# Patient Record
Sex: Female | Born: 1945 | Race: White | Hispanic: No | Marital: Married | Smoking: Former smoker
Health system: Southern US, Community
[De-identification: ages and names within clinical notes are randomized; demographics above are authoritative.]

## PROBLEM LIST (undated history)

## (undated) DIAGNOSIS — T8859XA Other complications of anesthesia, initial encounter: Secondary | ICD-10-CM

## (undated) DIAGNOSIS — K219 Gastro-esophageal reflux disease without esophagitis: Secondary | ICD-10-CM

## (undated) DIAGNOSIS — J45909 Unspecified asthma, uncomplicated: Secondary | ICD-10-CM

## (undated) DIAGNOSIS — F4310 Post-traumatic stress disorder, unspecified: Secondary | ICD-10-CM

## (undated) DIAGNOSIS — R112 Nausea with vomiting, unspecified: Secondary | ICD-10-CM

## (undated) DIAGNOSIS — T4145XA Adverse effect of unspecified anesthetic, initial encounter: Secondary | ICD-10-CM

## (undated) DIAGNOSIS — R0600 Dyspnea, unspecified: Secondary | ICD-10-CM

## (undated) DIAGNOSIS — I10 Essential (primary) hypertension: Secondary | ICD-10-CM

## (undated) DIAGNOSIS — R519 Headache, unspecified: Secondary | ICD-10-CM

## (undated) DIAGNOSIS — R51 Headache: Secondary | ICD-10-CM

## (undated) DIAGNOSIS — D649 Anemia, unspecified: Secondary | ICD-10-CM

## (undated) DIAGNOSIS — F419 Anxiety disorder, unspecified: Secondary | ICD-10-CM

## (undated) DIAGNOSIS — E119 Type 2 diabetes mellitus without complications: Secondary | ICD-10-CM

## (undated) DIAGNOSIS — G4730 Sleep apnea, unspecified: Secondary | ICD-10-CM

## (undated) DIAGNOSIS — M1990 Unspecified osteoarthritis, unspecified site: Secondary | ICD-10-CM

## (undated) DIAGNOSIS — L409 Psoriasis, unspecified: Secondary | ICD-10-CM

## (undated) DIAGNOSIS — Z9889 Other specified postprocedural states: Secondary | ICD-10-CM

## (undated) DIAGNOSIS — F32A Depression, unspecified: Secondary | ICD-10-CM

## (undated) DIAGNOSIS — G629 Polyneuropathy, unspecified: Secondary | ICD-10-CM

## (undated) DIAGNOSIS — N952 Postmenopausal atrophic vaginitis: Secondary | ICD-10-CM

## (undated) HISTORY — PX: JOINT REPLACEMENT: SHX530

## (undated) HISTORY — PX: EYE SURGERY: SHX253

## (undated) HISTORY — PX: DILATION AND CURETTAGE, DIAGNOSTIC / THERAPEUTIC: SUR384

## (undated) HISTORY — PX: DIAGNOSTIC LAPAROSCOPY: SUR761

## (undated) HISTORY — PX: TUBAL LIGATION: SHX77

---

## 1996-05-24 HISTORY — PX: BREAST BIOPSY: SHX20

## 2007-02-02 ENCOUNTER — Ambulatory Visit: Payer: Self-pay | Admitting: Family Medicine

## 2007-03-30 ENCOUNTER — Ambulatory Visit: Payer: Self-pay | Admitting: Gastroenterology

## 2008-06-04 ENCOUNTER — Ambulatory Visit: Payer: Self-pay | Admitting: Family Medicine

## 2009-12-22 ENCOUNTER — Ambulatory Visit: Payer: Self-pay | Admitting: Family Medicine

## 2009-12-24 ENCOUNTER — Ambulatory Visit: Payer: Self-pay | Admitting: Family Medicine

## 2010-08-12 ENCOUNTER — Ambulatory Visit: Payer: Self-pay | Admitting: Family Medicine

## 2011-09-07 ENCOUNTER — Ambulatory Visit: Payer: Self-pay | Admitting: Family Medicine

## 2012-05-10 ENCOUNTER — Encounter: Payer: Self-pay | Admitting: Family Medicine

## 2012-05-24 ENCOUNTER — Encounter: Payer: Self-pay | Admitting: Family Medicine

## 2012-06-24 ENCOUNTER — Encounter: Payer: Self-pay | Admitting: Family Medicine

## 2013-07-18 DIAGNOSIS — F4310 Post-traumatic stress disorder, unspecified: Secondary | ICD-10-CM | POA: Insufficient documentation

## 2013-08-27 ENCOUNTER — Ambulatory Visit: Payer: Self-pay | Admitting: Family Medicine

## 2013-09-04 ENCOUNTER — Ambulatory Visit: Payer: Self-pay | Admitting: Family Medicine

## 2013-09-10 DIAGNOSIS — J45909 Unspecified asthma, uncomplicated: Secondary | ICD-10-CM | POA: Insufficient documentation

## 2013-09-10 DIAGNOSIS — E559 Vitamin D deficiency, unspecified: Secondary | ICD-10-CM | POA: Insufficient documentation

## 2013-09-10 DIAGNOSIS — N952 Postmenopausal atrophic vaginitis: Secondary | ICD-10-CM | POA: Insufficient documentation

## 2014-03-06 ENCOUNTER — Ambulatory Visit: Payer: Self-pay | Admitting: Family Medicine

## 2014-11-22 ENCOUNTER — Other Ambulatory Visit: Payer: Self-pay | Admitting: Family Medicine

## 2014-11-22 DIAGNOSIS — N6310 Unspecified lump in the right breast, unspecified quadrant: Secondary | ICD-10-CM

## 2014-11-22 DIAGNOSIS — Z780 Asymptomatic menopausal state: Secondary | ICD-10-CM

## 2015-03-04 ENCOUNTER — Other Ambulatory Visit: Payer: Self-pay | Admitting: Family Medicine

## 2015-03-04 DIAGNOSIS — N6310 Unspecified lump in the right breast, unspecified quadrant: Secondary | ICD-10-CM

## 2015-03-24 ENCOUNTER — Ambulatory Visit
Admission: RE | Admit: 2015-03-24 | Discharge: 2015-03-24 | Disposition: A | Discharge status: Home / Self Care | Payer: Medicare Other | Source: Ambulatory Visit | Attending: Family Medicine | Admitting: Family Medicine

## 2015-03-24 ENCOUNTER — Other Ambulatory Visit: Payer: Self-pay | Admitting: Family Medicine

## 2015-03-24 DIAGNOSIS — Z780 Asymptomatic menopausal state: Secondary | ICD-10-CM | POA: Diagnosis present

## 2015-03-24 DIAGNOSIS — N6310 Unspecified lump in the right breast, unspecified quadrant: Secondary | ICD-10-CM

## 2015-03-24 DIAGNOSIS — N63 Unspecified lump in breast: Secondary | ICD-10-CM | POA: Diagnosis present

## 2015-03-24 DIAGNOSIS — Z13820 Encounter for screening for osteoporosis: Secondary | ICD-10-CM | POA: Insufficient documentation

## 2015-04-09 IMAGING — US US BREAST*R* LIMITED INC AXILLA
1 series · 4 of 4 positions shown · non-contrast
Comparison: 09/04/2013, 08/27/2013, 09/07/2011, additional priors
dating back to 02/02/2007

CLINICAL DATA: 67-year-old female, follow-up of probably benign
right breast findings

EXAM:
DIGITAL DIAGNOSTIC  RIGHT MAMMOGRAM WITH CAD
ULTRASOUND RIGHT BREAST

[Series 1: us breast*right* limited inc axilla · 0.08mm/px · 4 of 4 slices shown]
[im 1/4]
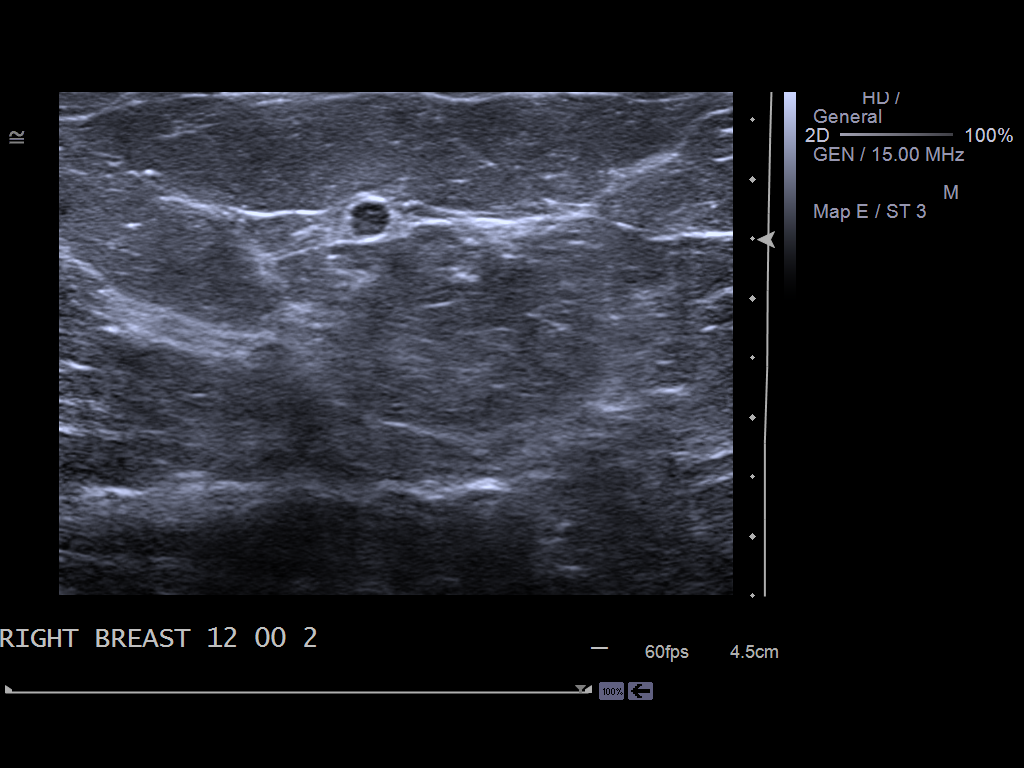
[im 2/4]
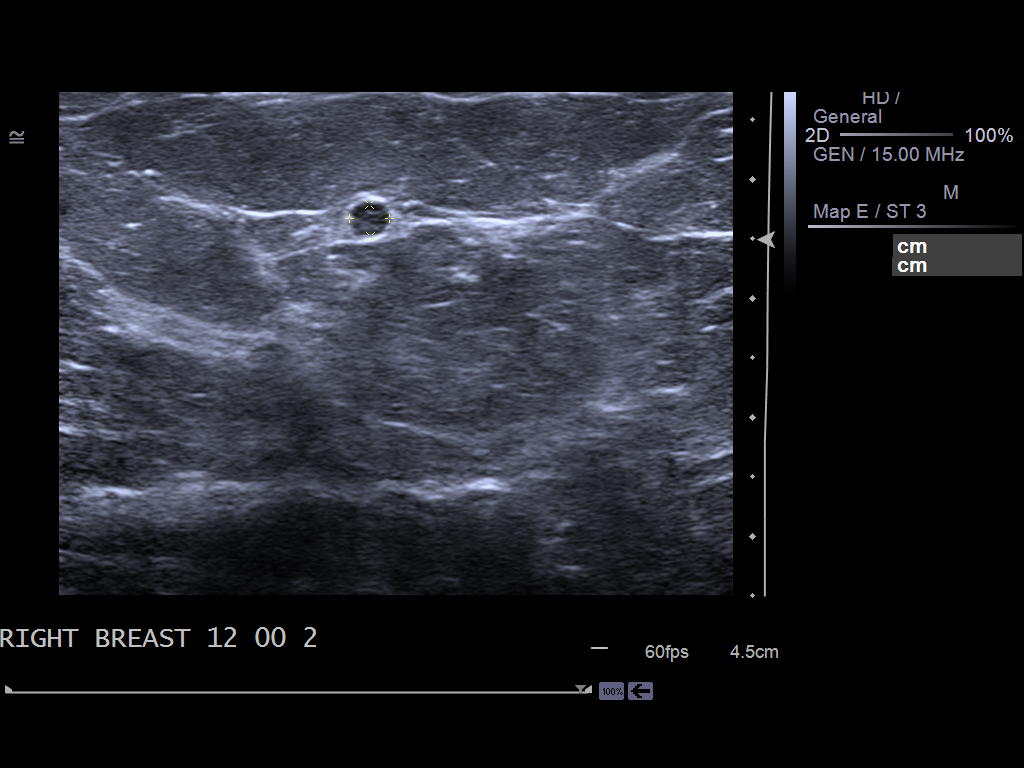
[im 3/4]
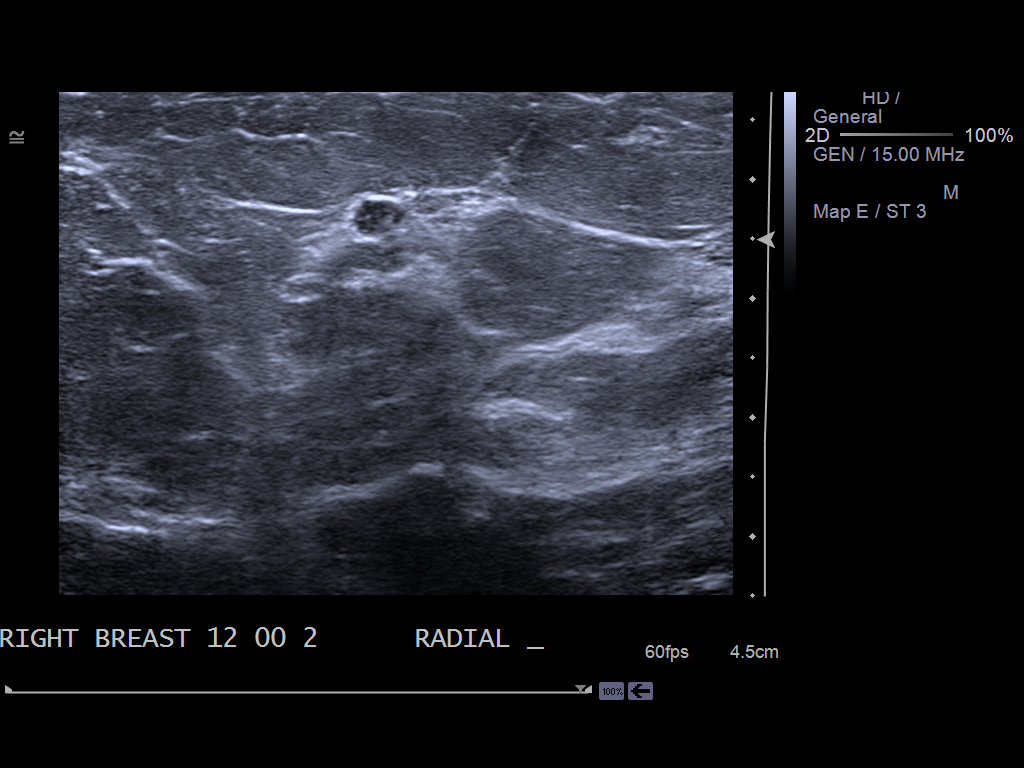
[im 4/4]
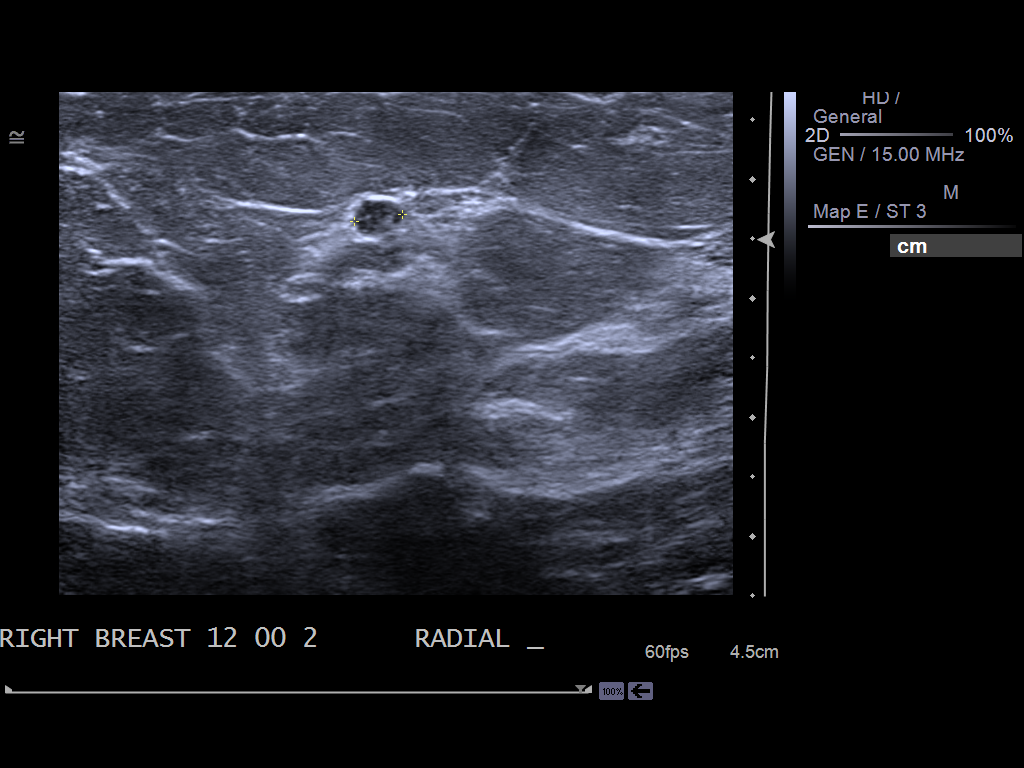

[4 of 4 positions shown; findings below may reference images not displayed]

ACR Breast Density Category b: There are scattered areas of
fibroglandular density.
FINDINGS: The previously seen mass within the subareolar right breast,
posterior depth does not appear significantly changed compared to
09/04/2013. No suspicious microcalcifications are identified.

Mammographic images were processed with CAD.

Targeted ultrasound of the right breast demonstrates no significant
interval change in appearance of an oval, hypoechoic circumscribed
mass at 12 o'clock, 2 cm from the nipple measuring 4 x 4 x 3 mm. As
noted previously, this finding is not thought to correspond to the
mammographic finding.
IMPRESSION: Probably benign right breast findings.

RECOMMENDATION:
Bilateral diagnostic mammogram and right breast ultrasound in 6
months.

I have discussed the findings and recommendations with the patient.
Results were also provided in writing at the conclusion of the
visit. If applicable, a reminder letter will be sent to the patient
regarding the next appointment.

BI-RADS CATEGORY  3: Probably benign.

## 2015-07-14 DIAGNOSIS — R0602 Shortness of breath: Secondary | ICD-10-CM | POA: Insufficient documentation

## 2015-12-16 ENCOUNTER — Other Ambulatory Visit: Payer: Self-pay | Admitting: Family Medicine

## 2015-12-16 DIAGNOSIS — N63 Unspecified lump in unspecified breast: Secondary | ICD-10-CM

## 2016-03-24 ENCOUNTER — Ambulatory Visit
Admission: RE | Admit: 2016-03-24 | Discharge: 2016-03-24 | Disposition: A | Discharge status: Home / Self Care | Payer: Medicare Other | Source: Ambulatory Visit | Attending: Family Medicine | Admitting: Family Medicine

## 2016-03-24 DIAGNOSIS — N63 Unspecified lump in unspecified breast: Secondary | ICD-10-CM

## 2016-03-24 DIAGNOSIS — N6310 Unspecified lump in the right breast, unspecified quadrant: Secondary | ICD-10-CM | POA: Diagnosis not present

## 2016-03-24 DIAGNOSIS — N6311 Unspecified lump in the right breast, upper outer quadrant: Secondary | ICD-10-CM | POA: Diagnosis not present

## 2016-04-05 ENCOUNTER — Encounter
Admission: RE | Admit: 2016-04-05 | Discharge: 2016-04-05 | Disposition: A | Discharge status: Home / Self Care | Payer: Medicare Other | Source: Ambulatory Visit | Attending: Surgery | Admitting: Surgery

## 2016-04-05 HISTORY — DX: Unspecified osteoarthritis, unspecified site: M19.90

## 2016-04-05 HISTORY — DX: Anemia, unspecified: D64.9

## 2016-04-05 HISTORY — DX: Adverse effect of unspecified anesthetic, initial encounter: T41.45XA

## 2016-04-05 HISTORY — DX: Polyneuropathy, unspecified: G62.9

## 2016-04-05 HISTORY — DX: Headache: R51

## 2016-04-05 HISTORY — DX: Post-traumatic stress disorder, unspecified: F43.10

## 2016-04-05 HISTORY — DX: Gastro-esophageal reflux disease without esophagitis: K21.9

## 2016-04-05 HISTORY — DX: Other specified postprocedural states: Z98.890

## 2016-04-05 HISTORY — DX: Sleep apnea, unspecified: G47.30

## 2016-04-05 HISTORY — DX: Other complications of anesthesia, initial encounter: T88.59XA

## 2016-04-05 HISTORY — DX: Unspecified asthma, uncomplicated: J45.909

## 2016-04-05 HISTORY — DX: Nausea with vomiting, unspecified: R11.2

## 2016-04-05 HISTORY — DX: Headache, unspecified: R51.9

## 2016-04-05 HISTORY — DX: Dyspnea, unspecified: R06.00

## 2016-04-05 HISTORY — DX: Anxiety disorder, unspecified: F41.9

## 2016-04-05 NOTE — Pre-Procedure Instructions (Signed)
Jacqueline HeckleKowalski, Bruce Jay, MD - 08/11/2015 3:30 PM EDT Formatting of this note may be different from the original. Established Patient Visit   Chief Complaint: Chief Complaint  Patient presents with  . Follow-up  echo and ett  . Shortness of Breath  Date of Service: 08/11/2015 Date of Birth: [birth year] PCP: Larene BeachVICKIE ANN Ether GriffinsFOWLER, MD  History of Present Illness: Jacqueline Miller is a 70 y.o.female patient  Patient returns today with continued shortness of breath with physical activity relieved by rest. This is waxing and waning over several month. The patient has had recent stress test showing a normal myocardial perfusion without evidence of myocardial ischemia and an echocardiogram showing normal LV systolic function with mild valvular heart disease and no evidence of pulmonary hypertension. The patient has had some improvements of shortness of breath and it appears that he does not need require other medication management or further intervention. The patient has on the CPAP machine and sleep apnea appropriately treated possibly consistent with current symptoms. Mixed hyperlipidemia has been watch very closely medication management and advice although currently will continue diet and exercise Results for orders placed or performed in visit on 08/05/15  Echocardiogram 2D complete  Result Value Ref Range  LV Ejection Fraction (%) 55  Aortic Valve Stenosis Grade none  Aortic Valve Regurgitation Grade trivial  Aortic Valve Max Velocity (m/s) 1.4 m/sec  Mitral Valve Stenosis Grade none  Mitral Valve Regurgitation Grade mild  Tricuspid Valve Regurgitation Grade mild  Tricuspid Valve Regurgitation Max Velocity (m/s) 2.3 m/sec  Right Ventricle Systolic Pressure (mmHg) 31.6 mmHg  LV End Diastolic Diameter (cm) 5.0 cm  LV End Systolic Diameter (cm) 3.5 cm  LV Septum Wall Thickness (cm) 1.3 cm  LV Posterior Wall Thickness (cm) 1.0 cm  Left Atrium Diameter (cm) 4.0 cm  Narrative  [facility] Hulan FrayMEYERS,  Rosey A [facility] [phone] [address], [place], [place] [id number] Acct #: [id number] Date: 08/05/2015 08:12 AM ECHOCARDIOGRAM REPORT Adult Female Age: [age] yrs Outpatient STUDY:CHEST WALL TAPE: KC::KCMC ECHO:Yes DOPPLER:Yes FILE: MD1: KOWALSKI, BRUCE JAY COLOR:Yes CONTRAST:No MACHINE:Accuson BP: 113/69 mmHg RV BIOPSY:No 3D:No SOUND QLTY:Moderate Height: 61 in MEDIUM:None Weight: 201 lb BSA: 1.9 m2  ___________________________________________________________________________________________ HISTORY:DOE REASON:Assess, LV function INDICATION:Shortness of breath [R06.02 (ICD-10-CM)]  ___________________________________________________________________________________________ ECHOCARDIOGRAPHIC MEASUREMENTS 2D DIMENSIONS AORTA Values Normal Range MAIN PA Values Normal Range Annulus: 1.7 cm [2.1 - 2.5] PA Main: nm* [1.5 - 2.1] Aorta Sin: nm* [2.7 - 3.3] RIGHT VENTRICLE ST Junction: nm* [2.3 - 2.9] RV Base: nm* [ < 4.2] Asc.Aorta: nm* [2.3 - 3.1] RV Mid: nm* [ < 3.5] LEFT VENTRICLE RV Length: nm* [ < 8.6] LVIDd: 5.0 cm [3.9 - 5.3] INFERIOR VENA CAVA LVIDs: 3.5 cm Max. IVC: nm* [ <= 2.1] FS: 30.0 % [> 25] Min. IVC: nm* SWT: 1.3 cm [0.5 - 0.9] ------------------ PWT: 1.0 cm [0.5 - 0.9] nm* - not measured LEFT ATRIUM LA Diam: 4.0 cm [2.7 - 3.8] LA A4C Area: nm* [ < 20] LA Volume: nm* [22 - 52]  ___________________________________________________________________________________________ ECHOCARDIOGRAPHIC DESCRIPTIONS  AORTIC ROOT Size:Normal Dissection:INDETERM FOR DISSECTION  AORTIC VALVE Leaflets:Tricuspid Morphology:Normal Mobility:Fully mobile  LEFT VENTRICLE Size:Normal Anterior:Normal Contraction:Normal Lateral:Normal Closest EF:>55% (Estimated) Septal:Normal LV Masses:No Masses Apical:Normal GNF:AOZHLVH:MILD LVH Inferior:Normal Posterior:Normal Dias.FxClass:N/A  MITRAL VALVE Leaflets:Normal Mobility:Fully mobile Morphology:Normal  LEFT ATRIUM Size:Normal LA  Masses:No masses IA Septum:Normal IAS  MAIN PA Size:Normal  PULMONIC VALVE Morphology:Normal Mobility:Fully mobile  RIGHT VENTRICLE RV Masses:No Masses Size:Normal Free Wall:Normal Contraction:Normal  TRICUSPID VALVE Leaflets:Normal Mobility:Fully mobile Morphology:Normal  RIGHT ATRIUM Size:Normal RA Other:None RA Mass:No masses  PERICARDIUM Fluid:No effusion  INFERIOR VENACAVA Size:Normal Normal respiratory collapse   _____________________________________________________________________ DOPPLER ECHO and OTHER SPECIAL PROCEDURES Aortic:TRIVIAL AR No AS 140.1 cm/sec peak vel 7.9 mmHg peak grad  Mitral:MILD MR No MS MV Inflow E Vel=76.3 cm/sec MV Annulus E'Vel=10.2 cm/sec E/E'Ratio=7.5  Tricuspid:MILD TR No TS 232.2 cm/sec peak TR vel 31.6 mmHg peak RV pressure  Pulmonary:No PR No PS 73.3 cm/sec peak vel 2.2 mmHg peak grad     ___________________________________________________________________________________________ INTERPRETATION NORMAL LEFT VENTRICULAR SYSTOLIC FUNCTION WITH MILD LVH MILD VALVULAR REGURGITATION (See above) NO VALVULAR STENOSIS EF 55%   ___________________________________________________________________________________________ Electronically signed by: MD Arnoldo Hooker on 08/06/2015 12:36 PM Performed By: Merla Riches, RDCS Ordering Physician: Arnoldo Hooker ___________________________________________________________________________________________   Past Medical and Surgical History  Past Medical History Past Medical History  Diagnosis Date  . Anxiety, unspecified  . Neuropathy (CMS-HCC)  10% service connected  . PTSD (post-traumatic stress disorder)  30% connected   Past Surgical History She has a past surgical history that includes Tubal ligation.   Medications and Allergies  Current Medications  Current Outpatient Prescriptions  Medication Sig Dispense Refill  . albuterol 90 mcg/actuation inhaler Inhale 2  inhalations into the lungs every 6 (six) hours as needed for Wheezing. 1 Inhaler 12  . ergocalciferol, vitamin D2, 50,000 unit capsule Take 1 capsule (50,000 Units total) by mouth once a week. 12 capsule 3  . escitalopram oxalate (LEXAPRO) 10 MG tablet Take 1 tablet (10 mg total) by mouth once daily. 90 tablet 3  . estradiol (VAGIFEM) 10 mcg vaginal tablet Place 1 tablet (10 mcg total) vaginally twice a week. 24 tablet 3  . inhalational spacer (AEROCHAMBER) spacer Use as instructed. 1 each 2  . naproxen (EC NAPROSYN) 500 MG EC tablet Take 1 tablet (500 mg total) by mouth 2 (two) times daily with meals. 60 tablet 2   No current facility-administered medications for this visit.   Allergies: Review of patient's allergies indicates no known allergies.  Social and Family History  Social History reports that she has quit smoking. She does not have any smokeless tobacco history on file.  Family History Family History  Problem Relation Age of Onset  . Coronary artery disease Father  . Alzheimer's disease Maternal Grandmother  . Pancreatic cancer Paternal Grandmother  . Coronary artery disease Paternal Grandfather   Review of Systems   Review of Systems  Positive for shortness of breath Negative for weight gain weight loss, weakness, vision change, hearing loss, cough, congestion, PND, orthopnea, heartburn, nausea, diaphoresis, vomiting, diarrhea, bloody stool, melena, stomach pain, extremity pain, leg weakness, leg cramping, leg blood clots, headache, blackouts, nosebleed, trouble swallowing, mouth pain, urinary frequency, urination at night, muscle weakness, skin lesions, skin rashes, tingling ,ulcers, numbness, anxiety,  Physical Examination   Vitals: Visit Vitals  . BP (P) 112/72  . Pulse (P) 72  . Ht (P) 154.9 cm (5\' 1" )  . Wt (P) 89.8 kg (198 lb)  . BMI (P) 37.41 kg/m2   Ht:(P) 154.9 cm (5\' 1" ) Wt:(P) 89.8 kg (198 lb) NWG:NFAO surface area is 1.97 meters squared (pended). Body  mass index is 37.41 kg/(m^2) (pended). Appearance: well appearing in no acute distress HEENT: Pupils equally reactive to light and accomodation, no xanthalasma  Neck: Supple, no apparent thyromegaly, masses, or lymphadenopathy  Lungs: normal respiratory effort; no crackles, no rhonchi,  Heart: Regular rate and rhythm. Normal S1 S2 No gallops, murmur, no rub, PMI is normal size  and placement. carotid upstroke normal without bruit. Jugular venous pressure is normal Abdomen: soft, nontender, not distended with normal bowel sounds. No apparent hepatosplenomegally. Abdominal aorta is normal size without bruit Extremities: no edema, no ulcers, no clubbing, no cyanosis Peripheral Pulses: 2+ in upper extremities, 2+ femoral pulses bilaterally, 2+lower extremity  Musculoskeletal; Normal muscle tone without kyphosis Neurological: Oriented and Alert, Cranial nerves intact  Assessment   70 y.o. female with  Encounter Diagnoses  Name Primary?  . OSA (obstructive sleep apnea) Yes  . Shortness of breath  . Mixed hyperlipidemia   Plan   -No further intervention with normal stress test and no evidence of chest pain at peak stress -Continue CPAP machine with sleep apnea due to concerns that this is causing the majority of her symptoms and issues -No additional medication management for hyperlipidemia at this time -Patient is to have an exercise program walking consistently to follow for worsening symptoms requiring additional medication management  No orders of the defined types were placed in this encounter.  Return in about 6 months (around 02/11/2016).  Jacqueline HeckleBRUCE JAY KOWALSKI, MD

## 2016-04-05 NOTE — Pre-Procedure Instructions (Signed)
Jacqueline Miller, Bruce Jay, MD - 07/14/2015 2:45 PM EST Formatting of this note may be different from the original. New Patient Visit   Chief Complaint: Chief Complaint  Patient presents with  . New Consultation  SOB  . Shortness of Breath  Date of Service: 07/14/2015 Date of Birth: 1945/06/29 PCP: Larene BeachVICKIE ANN Ether GriffinsFOWLER, MD  History of Present Illness: Ms. Jacqueline Miller is a 70 y.o.female patient Shortness of breath The patient presents with acute shortness of breath worsening with increased severity and frequency over the last 2 months which occurs with mild exertion and limits ADLs associated with climbing stairs and walking fast and relived by rest and lasting intermittent (1-10 minutes). Other related symptoms include fatigue and irregular heart beat. The differential diagnosis includes congestive heart failure, hypertension, valve disease, lung disease and/or sleep apnea, anginal equivalent, decrease exercise tolerance and rhythm disturbance  Sleep Apnea The patient has had a diagnosis of sleep apnea confirmed by formal sleep study in the past. The patient has been placed on a CPAP machine for which they have been diligent in its use. Recent evaluation has shown 100% compliance with an average of more than 6 hours of use per night. The patient has had significant benefit from the use of the CPAP machine with considerable improvements in quality of life, work production, and decreased medical complaints. The patient will need continued maintenance and care supplies as well as upgrades at appropriate times to continue helping with treatment of this diagnosis. Mixed Hyperlipidemia The patient has mixed hyperlipidemia with an LDL of 125 and an HDL of 46. They have other cardiovascular risk factors including age, HTN and Hyperlipidemia. We have had a long discussion of the reasons for medical management of the mixed hyperlipidemia including high LDL and greater than 7.5% ten year cardiovascular score. They have  voiced concerns of general medication side effects. We therefore have discussed the risks and benefits of medication management as well as exercise and diet. At this time the patient does not wish to pursue medication management. Pulmonary Function Test The patient has recently had pulmonary function tests performed for further evaluation of symptoms including shortness of breath. These pulmonary function tests have shown diminished FVC of 81% predicted and diminished FEV1 of 83% predicted and have suggested Restrictive pulmonary disease   Past Medical and Surgical History  Past Medical History Past Medical History  Diagnosis Date  . Anxiety, unspecified  . Neuropathy (CMS-HCC)  10% service connected  . PTSD (post-traumatic stress disorder)  30% connected   Past Surgical History She has a past surgical history that includes Tubal ligation.   Medications and Allergies  Current Medications  Current Outpatient Prescriptions  Medication Sig Dispense Refill  . albuterol 90 mcg/actuation inhaler Inhale 2 inhalations into the lungs every 6 (six) hours as needed for Wheezing. 1 Inhaler 12  . ergocalciferol, vitamin D2, 50,000 unit capsule Take 1 capsule (50,000 Units total) by mouth once a week. 12 capsule 3  . escitalopram oxalate (LEXAPRO) 10 MG tablet Take 1 tablet (10 mg total) by mouth once daily. 90 tablet 3  . estradiol (VAGIFEM) 10 mcg vaginal tablet Place 1 tablet (10 mcg total) vaginally twice a week. 24 tablet 3  . inhalational spacer (AEROCHAMBER) spacer Use as instructed. 1 each 2  . naproxen (EC NAPROSYN) 500 MG EC tablet Take 1 tablet (500 mg total) by mouth 2 (two) times daily with meals. 60 tablet 2   No current facility-administered medications for this visit.   Allergies: Review of  patient's allergies indicates no known allergies.  Social and Family History  Social History reports that she has quit smoking. She does not have any smokeless tobacco history on  file.  Family History Family History  Problem Relation Age of Onset  . Coronary artery disease Father  . Alzheimer's disease Maternal Grandmother  . Pancreatic cancer Paternal Grandmother  . Coronary artery disease Paternal Grandfather   Review of Systems  Positive for sob Review of Systems:negative for weight gain, wieght loss, weakness, fatigue, vision change, cough, congestion, PND, orthopnea, heartburn, nausea, vomiting, diarrhea, bloody stools, melena, stomach pain, leg weakness, leg pain, leg blood clots leg cramping, headache, blackouts, nosebleed, trouble swallowing, frequent urination, urination at night, skin rashes, skin lesions, muscle weakness, numbness, tingling, anxiety, depression  Physical Examination   Vitals: Visit Vitals  . BP 118/72  . Pulse 68  . Resp 15  . Ht 154.9 cm (5\' 1" )  . Wt 91.2 kg (201 lb)  . SpO2 96%  . BMI 37.98 kg/m2   Ht:154.9 cm (5\' 1" ) Wt:91.2 kg (201 lb) ZOX:WRUEBSA:Body surface area is 1.98 meters squared. Body mass index is 37.98 kg/(m^2). Appearance: well appearing in no acute distress HEENT: Pupils equally reactive to light and accomodation no apparent xantholasma or other apparent lesions  Neck: Supple without masses or lymphadenopathy Lungs: normal respiratory effort; no wheezes, no crackles, no rhonchi Heart: Regular rate and rhythm. Normal S1 S2 No gallops, murmurs, no rub, PMI is normal size and placement. carotid upstroke normal without bruit. Jugular venous pressure is normal Abdomen: nontender, non distended, with normal bowel sounds. Abdominal aorta is normal size without bruit. No apparent masses Extremities: No edema, no cyanosis, no clubbing, no ulcers Peripheral Pulses: 2+ in all extremities, 2+ femoral pulses bilaterally, 2+dp pulses Musculoskeletal; Normal muscle tone  Neurological: Cranial nerves intact, Oriented to time, place, and person  Assessment   70 y.o. female with  Encounter Diagnoses  Name Primary?  . Shortness  of breath Yes  . Mixed hyperlipidemia  . OSA (obstructive sleep apnea)   Plan  -Echocardiogram for further evaluation of dyspnea with cardiomyopathy, valvular heart disease, cardiomegally and pulmonary hypertension  -Regular Stress for shortness of breath, angina and chronotropic incompetence  -Continue diligent use of CPAP machine for symptom relief as well as reduction of pulmonary and cardiac manifestations. -We have had a long discussion about the risks and benefits of medication management including statin therapy for mixed hyperlipidemia. The patient understands these risks and benefits and wishes to defer medication management at this time. We have expressed other possible treatment options including diet and exercise. Additionally the patient has had discussion of further evaluation of vascular disease with possible calcium scoring, cardiac CT scan, and/or carotid ultrasound.  Orders Placed This Encounter  Procedures  . CARD stress test only, exercise  . Echocardiogram 2D complete   No Follow-up on file.  Jacqueline HeckleBRUCE JAY KOWALSKI, MD     Plan of Treatment - as of this encounter  Upcoming Encounters Upcoming Encounters  Date Type Specialty Care Team Description  04/21/2016 Post Op General Surgery Mikel CellaSmith, Jarvis [place] Jr., MD  [address]  [facility]  [place], [place] [address]  [phone]  [phone] (Fax)    06/01/2016 Office Visit Family Medicine Glenice BowFowler, Vickie Ann, MD  [address]  [facility]  [place], [place] [address]  [phone]  [phone] (Fax)     Imaging Results - in this encounter   Echocardiogram 2D complete (08/05/2015 9:01 AM) Echocardiogram 2D complete (08/05/2015  9:01 AM)  Component Value Ref Range  LV Ejection Fraction (%) 55   Aortic Valve Stenosis Grade none   Aortic Valve Regurgitation Grade trivial   Aortic Valve Max Velocity (m/s) 1.4 m/sec  Mitral Valve Stenosis Grade none   Mitral Valve  Regurgitation Grade mild   Tricuspid Valve Regurgitation Grade mild   Tricuspid Valve Regurgitation Max Velocity (m/s) 2.3 m/sec  Right Ventricle Systolic Pressure (mmHg) 31.6 mmHg  LV End Diastolic Diameter (cm) 5.0 cm  LV End Systolic Diameter (cm) 3.5 cm  LV Septum Wall Thickness (cm) 1.3 cm  LV Posterior Wall Thickness (cm) 1.0 cm  Left Atrium Diameter (cm) 4.0 cm   Echocardiogram 2D complete (08/05/2015 9:01 AM)  Specimen Performing Laboratory   [facility] OTHER ORDERS    Echocardiogram 2D complete (08/05/2015 9:01 AM)  Narrative  Kimberlee Nearing [facility] [id number] MEDICAL [address] [place], [place] [address] Acct #: [id number]   Date: 08/05/2015 08:12 AM   ECHOCARDIOGRAM REPORT Adult Female Age: 70 yrs   Outpatient  STUDY:CHEST WALL TAPE: KC::KCMC   ECHO:Yes DOPPLER:YesFILE: MD1:KOWALSKI, BRUCE JAY  COLOR:YesCONTRAST:NoMACHINE:AccusonBP: 113/69 mmHg  RV BIOPSY:No 3D:No SOUND QLTY:Moderate Height: 61 in   MEDIUM:None Weight: 201 lb   BSA: 1.9 m2    ___________________________________________________________________________________________   HISTORY:DOE  REASON:Assess, LV function  INDICATION:Shortness of breath [R06.02 (ICD-10-CM)]    ___________________________________________________________________________________________  ECHOCARDIOGRAPHIC MEASUREMENTS  2D DIMENSIONS   AORTA ValuesNormal RangeMAIN PAValuesNormal Range  Annulus:1.7 cm[2.1 - 2.5]PA Main:nm* [1.5 - 2.1]  Aorta Sin:nm* [2.7 - 3.3] RIGHT VENTRICLE  ST Junction:nm* [2.3 - 2.9]RV Base:nm* [ < 4.2]  Asc.Aorta:nm* [2.3 - 3.1] RV Mid:nm* [ < 3.5]  LEFT VENTRICLERV Length:nm* [ < 8.6]  LVIDd:5.0 cm[3.9 - 5.3] INFERIOR VENA CAVA  LVIDs:3.5 cmMax. IVC:nm* [ <= 2.1]   FS:30.0 %[> 25]Min. IVC:nm*  SWT:1.3 cm[0.5 - 0.9] ------------------  PWT:1.0 cm[0.5 - 0.9] nm* - not measured  LEFT ATRIUM  LA Diam:4.0 cm[2.7 - 3.8]  LA A4C Area:nm* [ < 20]  LA Volume:nm* [22 - 52]    ___________________________________________________________________________________________  ECHOCARDIOGRAPHIC DESCRIPTIONS    AORTIC ROOT  Size:Normal  Dissection:INDETERM FOR DISSECTION    AORTIC VALVE  Leaflets:Tricuspid Morphology:Normal  Mobility:Fully mobile    LEFT VENTRICLE  Size:NormalAnterior:Normal  Contraction:Normal Lateral:Normal  Closest EF:>55% (Estimated)Septal:Normal   LV Masses:No Masses Apical:Normal   XBJ:YNWG LVHInferior:Normal  Posterior:Normal  Dias.FxClass:N/A    MITRAL VALVE  Leaflets:NormalMobility:Fully mobile  Morphology:Normal    LEFT ATRIUM  Size:Normal LA Masses:No masses   IA Septum:Normal IAS    MAIN PA   Size:Normal    PULMONIC VALVE  Morphology:NormalMobility:Fully mobile    RIGHT VENTRICLE   RV Masses:No Masses Size:Normal   Free Wall:Normal Contraction:Normal    TRICUSPID VALVE  Leaflets:NormalMobility:Fully mobile  Morphology:Normal    RIGHT ATRIUM  Size:NormalRA Other:None   RA Mass:No masses    PERICARDIUM   Fluid:No effusion    INFERIOR VENACAVA  Size:Normal Normal respiratory collapse      _____________________________________________________________________  DOPPLER ECHO and OTHER SPECIAL PROCEDURES   Aortic:TRIVIAL ARNo AS  140.1 cm/sec peak vel 7.9 mmHg peak grad     Mitral:MILD MR No MS  MV Inflow E Vel=76.3 cm/sec MV Annulus E'Vel=10.2 cm/sec  E/E'Ratio=7.5    Tricuspid:MILD TR No TS  232.2 cm/sec peak TR vel31.6 mmHg peak RV pressure    Pulmonary:No PR No PS  73.3 cm/sec peak vel2.2 mmHg peak grad          ___________________________________________________________________________________________  INTERPRETATION  NORMAL LEFT VENTRICULAR SYSTOLIC FUNCTION WITH MILD LVH  MILD VALVULAR REGURGITATION (See above)  NO VALVULAR STENOSIS  EF 55%      ___________________________________________________________________________________________  Electronically signed by: MD Arnoldo Hooker on 08/06/2015 12:36 PM  Performed By: Merla Riches, RDCS  Ordering Physician: Arnoldo Hooker  ___________________________________________________________________________________________    Echocardiogram 2D complete (08/05/2015 9:01 AM)  Procedure Note  Interface, Text Results In - 08/06/2015 12:37 PM EDT  [facility] ZONNIE, LANDEN [facility] [phone] [address] MEDICAL [address] [place], [place] [address] Acct #: [id number] Date: 08/05/2015 08:12 AM ECHOCARDIOGRAM REPORT Adult Female Age: 70 yrs Outpatient STUDY:CHEST WALL TAPE: KC::KCMC ECHO:Yes DOPPLER:Yes FILE: MD1: KOWALSKI, BRUCE JAY COLOR:Yes CONTRAST:No MACHINE:Accuson BP: 113/69 mmHg RV BIOPSY:No 3D:No SOUND QLTY:Moderate Height: 61 in MEDIUM:None Weight: 201 lb BSA: 1.9 m2  ___________________________________________________________________________________________ HISTORY:DOE REASON:Assess, LV function INDICATION:Shortness of breath [R06.02 (ICD-10-CM)]  ___________________________________________________________________________________________ ECHOCARDIOGRAPHIC MEASUREMENTS 2D DIMENSIONS AORTA Values Normal Range MAIN PA Values  Normal Range Annulus: 1.7 cm [2.1 - 2.5] PA Main: nm*  [1.5 - 2.1] Aorta Sin: nm* [2.7 - 3.3] RIGHT VENTRICLE ST Junction: nm* [2.3 - 2.9] RV Base: nm*  [ < 4.2] Asc.Aorta: nm* [2.3 - 3.1] RV Mid: nm*  [ < 3.5] LEFT VENTRICLE RV Length: nm*  [ < 8.6] LVIDd: 5.0 cm [3.9 - 5.3] INFERIOR VENA CAVA LVIDs: 3.5 cm Max. IVC: nm*  [ <= 2.1] FS: 30.0 % [> 25] Min. IVC: nm* SWT: 1.3 cm [0.5 - 0.9] ------------------ PWT: 1.0 cm [0.5 - 0.9] nm* - not measured LEFT ATRIUM LA Diam: 4.0 cm [2.7 - 3.8] LA A4C Area: nm* [ < 20] LA Volume: nm* [22 - 52]  ___________________________________________________________________________________________ ECHOCARDIOGRAPHIC DESCRIPTIONS  AORTIC ROOT Size:Normal Dissection:INDETERM FOR DISSECTION  AORTIC VALVE Leaflets:Tricuspid Morphology:Normal Mobility:Fully mobile  LEFT VENTRICLE Size:Normal Anterior:Normal Contraction:Normal Lateral:Normal Closest EF:>55% (Estimated) Septal:Normal LV Masses:No Masses Apical:Normal JWJ:XBJY LVH Inferior:Normal Posterior:Normal Dias.FxClass:N/A  MITRAL VALVE Leaflets:Normal Mobility:Fully mobile Morphology:Normal  LEFT  ATRIUM Size:Normal LA Masses:No masses IA Septum:Normal IAS  MAIN PA Size:Normal  PULMONIC VALVE Morphology:Normal Mobility:Fully mobile  RIGHT VENTRICLE RV Masses:No Masses Size:Normal Free Wall:Normal Contraction:Normal  TRICUSPID VALVE Leaflets:Normal Mobility:Fully mobile Morphology:Normal  RIGHT ATRIUM Size:Normal RA Other:None RA Mass:No masses  PERICARDIUM Fluid:No effusion  INFERIOR VENACAVA Size:Normal Normal respiratory collapse   _____________________________________________________________________ DOPPLER ECHO and OTHER SPECIAL PROCEDURES Aortic:TRIVIAL AR No AS 140.1 cm/sec peak vel 7.9 mmHg peak grad  Mitral:MILD MR No MS MV Inflow E Vel=76.3 cm/sec MV Annulus E'Vel=10.2 cm/sec E/E'Ratio=7.5  Tricuspid:MILD TR No TS 232.2 cm/sec peak TR vel 31.6 mmHg peak RV pressure  Pulmonary:No PR No PS 73.3 cm/sec peak vel 2.2 mmHg peak grad     ___________________________________________________________________________________________ INTERPRETATION NORMAL LEFT VENTRICULAR SYSTOLIC FUNCTION WITH MILD LVH MILD VALVULAR REGURGITATION (See above) NO VALVULAR STENOSIS EF 55%   ___________________________________________________________________________________________ Electronically signed by: MD Arnoldo Hooker on 08/06/2015 12:36 PM Performed By: Merla Riches, RDCS Ordering Physician: Arnoldo Hooker ___________________________________________________________________________________________     Miscellaneous Results - in this encounter   CARD stress test only, exercise (08/05/2015 9:15 AM)   Visit Diagnoses    Diagnosis  Shortness of breath - Primary  Mixed hyperlipidemia  OSA (obstructive sleep apnea)  Obstructive sleep apnea (adult) (pediatric)    Images Document Information   Service Providers Document Coverage Dates Feb. 20, 2017  Custodian Organization [facility] [phone] (Work) [place], [place] [address]     Encounter Providers Bruce Maureen Ralphs MD (Attending) tel:[phone] (Work) [phone] [address] [facility] [place],  [place] [address]   Encounter Date Feb. 20, 2017

## 2016-04-05 NOTE — Patient Instructions (Signed)
  Your procedure is scheduled on: 04-09-16 Report to Same Day Surgery 2nd floor medical mall To find out your arrival time please call [phone] between 1PM - 3PM on 04-08-16  Remember: Instructions that are not followed completely may result in serious medical risk, up to and including death, or upon the discretion of your surgeon and anesthesiologist your surgery may need to be rescheduled.    _x___ 1. Do not eat food or drink liquids after midnight. No gum chewing or hard candies.     __x__ 2. No Alcohol for 24 hours before or after surgery.   __x__3. No Smoking for 24 prior to surgery.   ____  4. Bring all medications with you on the day of surgery if instructed.    __x__ 5. Notify your doctor if there is any change in your medical condition     (cold, fever, infections).     Do not wear jewelry, make-up, hairpins, clips or nail polish.  Do not wear lotions, powders, or perfumes. You may wear deodorant.  Do not shave 48 hours prior to surgery. Men may shave face and neck.  Do not bring valuables to the hospital.    [facility] is not responsible for any belongings or valuables.               Contacts, dentures or bridgework may not be worn into surgery.  Leave your suitcase in the car. After surgery it may be brought to your room.  For patients admitted to the hospital, discharge time is determined by your treatment team.   Patients discharged the day of surgery will not be allowed to drive home.    Please read over the following fact sheets that you were given:   [facility] Preparing for Surgery and or MRSA Information   ____ Take these medicines the morning of surgery with A SIP OF WATER:    1. NONE  2.  3.  4.  5.  6.  ____Fleets enema or Magnesium Citrate as directed.   ____ Use CHG Soap or sage wipes as directed on instruction sheet   _X___ Use inhalers on the day of surgery and bring to hospital day of surgery-USE ALBUTEROL INHALER AT HOME AND BRING TO  HOSPITAL  ____ Stop metformin 2 days prior to surgery    ____ Take 1/2 of usual insulin dose the night before surgery and none on the morning of surgery.   ____ Stop aspirin or coumadin, or plavix  x__ Stop Anti-inflammatories such as Advil, Aleve, Ibuprofen, Motrin, Naproxen,          Naprosyn, Goodies powders or aspirin products. Ok to take Tylenol.   ____ Stop supplements until after surgery.    _X___ Bring C-Pap to the hospital.

## 2016-04-05 NOTE — Pre-Procedure Instructions (Signed)
ECG 12-lead12/30/2016 [facility] Component Name Value Ref Range  Vent Rate (bpm) 82   PR Interval (msec) 128   QRS Interval (msec) 76   QT Interval (msec) 366   QTc (msec) 427   Result Narrative  Normal sinus rhythm Normal ECG No previous ECGs available I reviewed and concur with this report. Electronically signed UJ:[id number]:STIBER, MD, Christiane HaJONATHAN [phone]([id number]) on 05/23/2015 12:38:17 PM  Status Results Details    [facility] Visit on 05/23/2015 [facility]")' href="[email or web address]/">Encounter Summary

## 2016-04-08 ENCOUNTER — Encounter: Payer: Self-pay | Admitting: *Deleted

## 2016-04-09 ENCOUNTER — Ambulatory Visit: Payer: Medicare Other | Admitting: Anesthesiology

## 2016-04-09 ENCOUNTER — Ambulatory Visit: Payer: Medicare Other

## 2016-04-09 ENCOUNTER — Encounter
Admission: RE | Disposition: A | Discharge status: Home / Self Care | Payer: Self-pay | Source: Ambulatory Visit | Attending: Surgery

## 2016-04-09 ENCOUNTER — Encounter: Payer: Self-pay | Admitting: *Deleted

## 2016-04-09 ENCOUNTER — Ambulatory Visit
Admission: RE | Admit: 2016-04-09 | Discharge: 2016-04-09 | Disposition: A | Discharge status: Home / Self Care | Payer: Medicare Other | Source: Ambulatory Visit | Attending: Surgery | Admitting: Surgery

## 2016-04-09 DIAGNOSIS — M1990 Unspecified osteoarthritis, unspecified site: Secondary | ICD-10-CM | POA: Diagnosis not present

## 2016-04-09 DIAGNOSIS — F4310 Post-traumatic stress disorder, unspecified: Secondary | ICD-10-CM | POA: Insufficient documentation

## 2016-04-09 DIAGNOSIS — G4730 Sleep apnea, unspecified: Secondary | ICD-10-CM | POA: Insufficient documentation

## 2016-04-09 DIAGNOSIS — D649 Anemia, unspecified: Secondary | ICD-10-CM | POA: Diagnosis not present

## 2016-04-09 DIAGNOSIS — F419 Anxiety disorder, unspecified: Secondary | ICD-10-CM | POA: Diagnosis not present

## 2016-04-09 DIAGNOSIS — K8010 Calculus of gallbladder with chronic cholecystitis without obstruction: Secondary | ICD-10-CM | POA: Insufficient documentation

## 2016-04-09 DIAGNOSIS — K219 Gastro-esophageal reflux disease without esophagitis: Secondary | ICD-10-CM | POA: Insufficient documentation

## 2016-04-09 DIAGNOSIS — J45909 Unspecified asthma, uncomplicated: Secondary | ICD-10-CM | POA: Insufficient documentation

## 2016-04-09 DIAGNOSIS — K819 Cholecystitis, unspecified: Secondary | ICD-10-CM

## 2016-04-09 HISTORY — PX: CHOLECYSTECTOMY: SHX55

## 2016-04-09 SURGERY — LAPAROSCOPIC CHOLECYSTECTOMY WITH INTRAOPERATIVE CHOLANGIOGRAM
Anesthesia: General | Wound class: Clean Contaminated

## 2016-04-09 MED ORDER — FAMOTIDINE 20 MG PO TABS
20.0000 mg | ORAL_TABLET | Freq: Once | ORAL | Status: AC
  Administered 2016-04-09: 20 mg via ORAL

## 2016-04-09 MED ORDER — SCOPOLAMINE 1 MG/3DAYS TD PT72
MEDICATED_PATCH | TRANSDERMAL | Status: AC
  Filled 2016-04-09: qty 1

## 2016-04-09 MED ORDER — LACTATED RINGERS IV SOLN
INTRAVENOUS | Status: DC
  Administered 2016-04-09 (×2): via INTRAVENOUS

## 2016-04-09 MED ORDER — ROCURONIUM BROMIDE 100 MG/10ML IV SOLN
INTRAVENOUS | Status: DC | PRN
  Administered 2016-04-09: 20 mg via INTRAVENOUS
  Administered 2016-04-09 (×2): 10 mg via INTRAVENOUS
  Administered 2016-04-09: 5 mg via INTRAVENOUS

## 2016-04-09 MED ORDER — HEPARIN SODIUM (PORCINE) 5000 UNIT/ML IJ SOLN
INTRAMUSCULAR | Status: AC
  Filled 2016-04-09: qty 1

## 2016-04-09 MED ORDER — SUCCINYLCHOLINE CHLORIDE 20 MG/ML IJ SOLN
INTRAMUSCULAR | Status: DC | PRN
  Administered 2016-04-09: 100 mg via INTRAVENOUS

## 2016-04-09 MED ORDER — HYDROCODONE-ACETAMINOPHEN 5-325 MG PO TABS: 1.0000 | ORAL_TABLET | ORAL | Status: DC | PRN

## 2016-04-09 MED ORDER — ONDANSETRON HCL 4 MG/2ML IJ SOLN: 4.0000 mg | Freq: Once | INTRAMUSCULAR | Status: DC | PRN

## 2016-04-09 MED ORDER — PROPOFOL 10 MG/ML IV BOLUS
INTRAVENOUS | Status: DC | PRN
Start: 2016-04-09 — End: 2016-04-09
  Administered 2016-04-09: 130 mg via INTRAVENOUS

## 2016-04-09 MED ORDER — SUGAMMADEX SODIUM 200 MG/2ML IV SOLN
INTRAVENOUS | Status: DC | PRN
Start: 2016-04-09 — End: 2016-04-09
  Administered 2016-04-09: 200 mg via INTRAVENOUS

## 2016-04-09 MED ORDER — MIDAZOLAM HCL 2 MG/2ML IJ SOLN
INTRAMUSCULAR | Status: DC | PRN
  Administered 2016-04-09: 2 mg via INTRAVENOUS

## 2016-04-09 MED ORDER — HYDROMORPHONE HCL 1 MG/ML IJ SOLN
INTRAMUSCULAR | Status: DC | PRN
  Administered 2016-04-09: 0.5 mg via INTRAVENOUS
  Administered 2016-04-09: .2 mg via INTRAVENOUS
  Administered 2016-04-09: .5 mg via INTRAVENOUS

## 2016-04-09 MED ORDER — FENTANYL CITRATE (PF) 100 MCG/2ML IJ SOLN
INTRAMUSCULAR | Status: DC | PRN
  Administered 2016-04-09: 50 ug via INTRAVENOUS

## 2016-04-09 MED ORDER — FAMOTIDINE 20 MG PO TABS
ORAL_TABLET | ORAL | Status: AC
  Filled 2016-04-09: qty 1

## 2016-04-09 MED ORDER — HYDROCODONE-ACETAMINOPHEN 5-325 MG PO TABS: 1.0000 | ORAL_TABLET | ORAL | 0 refills | Status: DC | PRN

## 2016-04-09 MED ORDER — ONDANSETRON HCL 4 MG/2ML IJ SOLN
INTRAMUSCULAR | Status: DC | PRN
  Administered 2016-04-09: 4 mg via INTRAVENOUS

## 2016-04-09 MED ORDER — SODIUM CHLORIDE 0.9 % IJ SOLN
INTRAMUSCULAR | Status: AC
  Filled 2016-04-09: qty 50

## 2016-04-09 MED ORDER — FENTANYL CITRATE (PF) 100 MCG/2ML IJ SOLN: 25.0000 ug | INTRAMUSCULAR | Status: DC | PRN

## 2016-04-09 MED ORDER — DEXAMETHASONE SODIUM PHOSPHATE 10 MG/ML IJ SOLN
INTRAMUSCULAR | Status: DC | PRN
  Administered 2016-04-09: 5 mg via INTRAVENOUS

## 2016-04-09 MED ORDER — SCOPOLAMINE 1 MG/3DAYS TD PT72
1.0000 | MEDICATED_PATCH | Freq: Once | TRANSDERMAL | Status: DC
  Administered 2016-04-09: 1.5 mg via TRANSDERMAL

## 2016-04-09 MED ORDER — LIDOCAINE HCL (CARDIAC) 20 MG/ML IV SOLN
INTRAVENOUS | Status: DC | PRN
  Administered 2016-04-09: 100 mg via INTRAVENOUS

## 2016-04-09 SURGICAL SUPPLY — 41 items
APPLIER CLIP ROT 10 11.4 M/L (STAPLE) ×2
BENZOIN TINCTURE PRP APPL 2/3 (GAUZE/BANDAGES/DRESSINGS) ×2 IMPLANT
CANISTER SUCT 1200ML W/VALVE (MISCELLANEOUS) ×2 IMPLANT
CANNULA DILATOR 10 W/SLV (CANNULA) ×2 IMPLANT
CATH REDDICK CHOLANGI 4FR 50CM (CATHETERS) ×2 IMPLANT
CHLORAPREP W/TINT 26ML (MISCELLANEOUS) ×2 IMPLANT
CLIP APPLIE ROT 10 11.4 M/L (STAPLE) ×1 IMPLANT
DRAPE SHEET LG 3/4 BI-LAMINATE (DRAPES) ×2 IMPLANT
ELECT REM PT RETURN 9FT ADLT (ELECTROSURGICAL) ×2
ELECTRODE REM PT RTRN 9FT ADLT (ELECTROSURGICAL) ×1 IMPLANT
GAUZE SPONGE 4X4 12PLY STRL (GAUZE/BANDAGES/DRESSINGS) ×2 IMPLANT
GLOVE BIO SURGEON STRL SZ7.5 (GLOVE) ×2 IMPLANT
GOWN STRL REUS W/ TWL LRG LVL3 (GOWN DISPOSABLE) ×4 IMPLANT
GOWN STRL REUS W/TWL LRG LVL3 (GOWN DISPOSABLE) ×4
IRRIGATION STRYKERFLOW (MISCELLANEOUS) ×1 IMPLANT
IRRIGATOR STRYKERFLOW (MISCELLANEOUS) ×2
IV NS 1000ML (IV SOLUTION) ×1
IV NS 1000ML BAXH (IV SOLUTION) ×1 IMPLANT
KIT RM TURNOVER STRD PROC AR (KITS) ×2 IMPLANT
LABEL OR SOLS (LABEL) ×2 IMPLANT
LIQUID BAND (GAUZE/BANDAGES/DRESSINGS) ×2 IMPLANT
NDL INSUFF ACCESS 14 VERSASTEP (NEEDLE) ×2 IMPLANT
NEEDLE FILTER BLUNT 18X 1/2SAF (NEEDLE) ×1
NEEDLE FILTER BLUNT 18X1 1/2 (NEEDLE) ×1 IMPLANT
NS IRRIG 500ML POUR BTL (IV SOLUTION) ×2 IMPLANT
PACK LAP CHOLECYSTECTOMY (MISCELLANEOUS) ×2 IMPLANT
RETRACT II ENDO 10MM 32CML (ENDOMECHANICALS) ×2
RETRACTOR II ENDO 10MM 32CML (ENDOMECHANICALS) ×1 IMPLANT
SCISSORS METZENBAUM CVD 33 (INSTRUMENTS) ×2 IMPLANT
SEAL FOR SCOPE WARMER C3101 (MISCELLANEOUS) ×2 IMPLANT
SLEEVE ENDOPATH XCEL 5M (ENDOMECHANICALS) ×2 IMPLANT
STRIP CLOSURE SKIN 1/2X4 (GAUZE/BANDAGES/DRESSINGS) ×2 IMPLANT
STRIP CLOSURE SKIN 1/4X4 (GAUZE/BANDAGES/DRESSINGS) ×2 IMPLANT
SUT CHROMIC 5 0 RB 1 27 (SUTURE) ×2 IMPLANT
SUT VIC AB 0 CT2 27 (SUTURE) IMPLANT
SYR 3ML LL SCALE MARK (SYRINGE) ×2 IMPLANT
TROCAR XCEL NON-BLD 11X100MML (ENDOMECHANICALS) ×2 IMPLANT
TROCAR XCEL NON-BLD 5MMX100MML (ENDOMECHANICALS) ×2 IMPLANT
TROCAR XCEL UNIV SLVE 11M 100M (ENDOMECHANICALS) ×2 IMPLANT
TUBING INSUFFLATOR HI FLOW (MISCELLANEOUS) ×2 IMPLANT
WATER STERILE IRR 1000ML POUR (IV SOLUTION) ×2 IMPLANT

## 2016-04-09 NOTE — Transfer of Care (Signed)
Immediate Anesthesia Transfer of Care Note  Patient: Jacqueline DellSusan L Miller  Procedure(s) Performed: Procedure(s): LAPAROSCOPIC CHOLECYSTECTOMY WITH INTRAOPERATIVE CHOLANGIOGRAM (N/A)  Patient Location: PACU  Anesthesia Type:General  Level of Consciousness: sedated  Airway & Oxygen Therapy: Patient Spontanous Breathing and Patient connected to face mask oxygen  Post-op Assessment: Report given to RN and Post -op Vital signs reviewed and stable  Post vital signs: Reviewed and stable  Last Vitals:  Vitals:   04/09/16 0610 04/09/16 0958  BP: 132/70 (!) 142/60  Pulse: 74 89  Resp: 17   Temp: 36.7 C 36.3 C    Last Pain:  Vitals:   04/09/16 0610  TempSrc: Tympanic         Complications: No apparent anesthesia complications

## 2016-04-09 NOTE — Discharge Instructions (Signed)
Take Tylenol or Norco if needed for pain.  Should not drive or do anything dangerous when taking Norco.  Remove dressings on Saturday. May shower Sunday.  Gradually increase activities as tolerated.  Avoid straining and heavy lifting during the first week after surgery.

## 2016-04-09 NOTE — H&P (Signed)
  She reports no change in condition since office exam.  Labs noted.  Discussed plan for lap cholecystectomy 

## 2016-04-09 NOTE — Anesthesia Preprocedure Evaluation (Signed)
Anesthesia Evaluation  Patient identified by MRN, date of birth, ID band Patient awake    History of Anesthesia Complications (+) PONV  Airway Mallampati: III  TM Distance: <3 FB     Dental  (+) Caps   Pulmonary shortness of breath and with exertion, asthma , sleep apnea ,    Pulmonary exam normal        Cardiovascular negative cardio ROS Normal cardiovascular exam     Neuro/Psych  Headaches, PSYCHIATRIC DISORDERS Anxiety  Neuromuscular disease    GI/Hepatic Neg liver ROS, GERD  Medicated,  Endo/Other  negative endocrine ROS  Renal/GU negative Renal ROS  negative genitourinary   Musculoskeletal  (+) Arthritis , Osteoarthritis,    Abdominal Normal abdominal exam  (+)   Peds negative pediatric ROS (+)  Hematology  (+) anemia ,   Anesthesia Other Findings   Reproductive/Obstetrics                             Anesthesia Physical Anesthesia Plan  ASA: III  Anesthesia Plan: General   Post-op Pain Management:    Induction: Intravenous  Airway Management Planned: Oral ETT  Additional Equipment:   Intra-op Plan:   Post-operative Plan: Extubation in OR  Informed Consent: I have reviewed the patients History and Physical, chart, labs and discussed the procedure including the risks, benefits and alternatives for the proposed anesthesia with the patient or authorized representative who has indicated his/her understanding and acceptance.   Dental advisory given  Plan Discussed with: CRNA and Surgeon  Anesthesia Plan Comments:         Anesthesia Quick Evaluation

## 2016-04-09 NOTE — Anesthesia Procedure Notes (Signed)
Procedure Name: Intubation Date/Time: 04/09/2016 7:40 AM Performed by: Almeta MonasFLETCHER, Lawernce Earll Pre-anesthesia Checklist: Patient identified, Emergency Drugs available and Suction available Patient Re-evaluated:Patient Re-evaluated prior to inductionOxygen Delivery Method: Circle system utilized Preoxygenation: Pre-oxygenation with 100% oxygen Intubation Type: IV induction Ventilation: Mask ventilation without difficulty Laryngoscope Size: Miller and 2 Grade View: Grade III Tube type: Oral Tube size: 7.0 mm Number of attempts: 1 Airway Equipment and Method: Stylet,  Bougie stylet and Patient positioned with wedge pillow Placement Confirmation: ETT inserted through vocal cords under direct vision,  positive ETCO2 and breath sounds checked- equal and bilateral Secured at: 19 cm Tube secured with: Tape Dental Injury: Teeth and Oropharynx as per pre-operative assessment  Difficulty Due To: Difficulty was anticipated Future Recommendations: Recommend- induction with short-acting agent, and alternative techniques readily available Comments: Difficulty anticipated due to large neck circumference, TD<3cm, DL x 1 with miller 2, could only visualize bottom of artytenoids, passed bougie and placed ETT over bougie,

## 2016-04-09 NOTE — Op Note (Signed)
OPERATIVE REPORT  PREOPERATIVE DIAGNOSIS:  Chronic cholecystitis cholelithiasis  POSTOPERATIVE DIAGNOSIS: Chronic cholecystitis cholelithiasis  PROCEDURE: Laparoscopic cholecystectomy   ANESTHESIA: General  SURGEON: Renda RollsWilton Smith M.D.  INDICATIONS: She has a history of epigastric pain and CT findings of gallstones.    With the patient on the operating table in the supine position under general endotracheal anesthesia the abdomen was prepared with ChloraPrep solution and draped in a sterile manner. A short incision was made in the inferior aspect of the umbilicus and carried down to the deep fascia which was grasped with a laryngeal hook. A Veress needle was inserted aspirated and irrigated with a saline solution. The peritoneal cavity was insufflated with carbon dioxide. The Veress needle was removed. The 10 mm cannula was inserted. The 10 mm 0 laparoscope was inserted to view the peritoneal cavity.  Another incision was made in the epigastrium slightly to the right of the midline to introduce an 11 mm cannula. 2 incisions were made in the lateral aspect of the right upper quadrant to introduce 2   5 mm cannulas. Initial inspection revealed typical appearance of portion of stomach small bowel and colon. The liver surface was smooth.  The gallbladder was retracted towards the right shoulder.  The gallbladder neck was retracted inferiorly and laterally. At this point there is minimal mobility of the gallbladder neck and limited exposure. Another incision was made in the epigastrium just about 3 inches cephalad to the umbilicus to insert an 11 mm cannula moving the camera to this site. A 5 finger fan retractor was introduced through the umbilical port and retracted the omentum and transverse colon to improve exposure. The porta hepatis was identified. The gallbladder was mobilized with incision of the visceral peritoneum. The cystic artery was dissected free from surrounding structures and was  controlled with double endoclips and divided providing better exposure to the site of the cystic duct. The cystic duct was dissected free from surrounding structures. An Endo Clip was placed across the cystic duct adjacent to the gallbladder neck. An incision was made in the cystic duct to introduce a Reddick catheter. Several stones were manipulated out of the cystic duct and removed with the stone scoop. With instilling saline into the Reddick catheter was apparent that the cystic duct was obstructed as the fluid came out of the cystic duct even with some manipulation of the balloon. The Reddick catheter was removed. The cystic duct was doubly ligated with endoclips and divided. The cystic artery was controlled with double endoclips and divided. The gallbladder was dissected free from the liver with use of hook and cautery and blunt dissection. Bleeding was minimal and hemostasis was intact. The gallbladder was delivered up through the infraumbilical incision opened and suctioned. The bile was clear and colorless. Multiple stones were removed with the stone scoop. The gallbladder was delivered up out of the abdomen and submitted in formalin with stones for routine pathology. The right upper quadrant was further inspected and could see hemostasis was intact. The cannulas were removed seeing no bleeding from the cannula sites. Carbon dioxide was allowed to escape from the peritoneal cavity. The skin incisions were closed with interrupted 5-0 chromic subcutaneous suture benzoin and Steri-Strips. Gauze dressings were applied with paper tape.  The patient appeared to be in satisfactory condition and was prepared for transfer to the recovery room  Renda RollsWilton Smith M.D.

## 2016-04-10 NOTE — Anesthesia Postprocedure Evaluation (Signed)
Anesthesia Post Note  Patient: Jacqueline DellSusan L Morine  Procedure(s) Performed: Procedure(s) (LRB): LAPAROSCOPIC CHOLECYSTECTOMY WITH INTRAOPERATIVE CHOLANGIOGRAM (N/A)  Patient location during evaluation: PACU Anesthesia Type: General Level of consciousness: awake and alert and oriented Pain management: pain level controlled Vital Signs Assessment: post-procedure vital signs reviewed and stable Respiratory status: spontaneous breathing Cardiovascular status: blood pressure returned to baseline Anesthetic complications: no    Last Vitals:  Vitals:   04/09/16 1100 04/09/16 1105  BP: 128/67 (!) 121/59  Pulse: 80   Resp: 16   Temp: 37.5 C     Last Pain:  Vitals:   04/09/16 1100  TempSrc:   PainSc: 3                  Maritta Kief

## 2016-04-12 LAB — SURGICAL PATHOLOGY

## 2016-06-01 DIAGNOSIS — H1013 Acute atopic conjunctivitis, bilateral: Secondary | ICD-10-CM | POA: Insufficient documentation

## 2016-06-01 DIAGNOSIS — J309 Allergic rhinitis, unspecified: Secondary | ICD-10-CM | POA: Insufficient documentation

## 2016-06-01 DIAGNOSIS — G4733 Obstructive sleep apnea (adult) (pediatric): Secondary | ICD-10-CM | POA: Insufficient documentation

## 2016-06-01 DIAGNOSIS — E669 Obesity, unspecified: Secondary | ICD-10-CM | POA: Insufficient documentation

## 2016-06-01 DIAGNOSIS — R7302 Impaired glucose tolerance (oral): Secondary | ICD-10-CM | POA: Insufficient documentation

## 2016-07-29 ENCOUNTER — Encounter: Payer: Self-pay | Admitting: Family Medicine

## 2016-07-29 ENCOUNTER — Ambulatory Visit
Admission: EM | Admit: 2016-07-29 | Discharge: 2016-07-29 | Disposition: A | Discharge status: Home / Self Care | Payer: Medicare Other | Attending: Family Medicine | Admitting: Family Medicine

## 2016-07-29 DIAGNOSIS — R42 Dizziness and giddiness: Secondary | ICD-10-CM

## 2016-07-29 MED ORDER — MECLIZINE HCL 25 MG PO TABS: 25.0000 mg | ORAL_TABLET | Freq: Three times a day (TID) | ORAL | 0 refills | Status: DC | PRN

## 2016-07-29 MED ORDER — ONDANSETRON 8 MG PO TBDP: 8.0000 mg | ORAL_TABLET | Freq: Three times a day (TID) | ORAL | 0 refills | Status: DC | PRN

## 2016-07-29 MED ORDER — FEXOFENADINE-PSEUDOEPHED ER 180-240 MG PO TB24: 1.0000 | ORAL_TABLET | Freq: Every day | ORAL | 0 refills | Status: DC

## 2016-07-29 MED ORDER — PREDNISONE 10 MG (21) PO TBPK: ORAL_TABLET | ORAL | 0 refills | Status: DC

## 2016-07-29 NOTE — ED Provider Notes (Signed)
[facility]    CSN: [id number] Arrival date & time: 07/29/16  1054     History   Chief Complaint Chief Complaint  Patient presents with  . Dizziness    HPI Jacqueline Miller is a 71 y.o. female.   HPI  Past Medical History:  Diagnosis Date  . Anemia    H/O  . Anxiety   . Arthritis   . Asthma    WELL CONTROLLED  . Complication of anesthesia   . Dyspnea    HAS SEEN DR Gwen PoundsKOWALSKI AND HAD STRESS TEST IN MARCH 2017 WITH NORMAL RESULTS  . GERD (gastroesophageal reflux disease)    OCC-TUMS  . Headache    H/O MIGRAINES  . Neuropathy (HCC)   . PONV (postoperative nausea and vomiting)   . PTSD (post-traumatic stress disorder)   . Sleep apnea    USES CPAP    There are no active problems to display for this patient.   Past Surgical History:  Procedure Laterality Date  . BREAST BIOPSY Left 1998   neg  . CHOLECYSTECTOMY N/A 04/09/2016   Procedure: LAPAROSCOPIC CHOLECYSTECTOMY WITH INTRAOPERATIVE CHOLANGIOGRAM;  Surgeon: Nadeen LandauJarvis Wilton Smith, MD;  Location: [facility] ORS;  Service: General;  Laterality: N/A;  . DIAGNOSTIC LAPAROSCOPY    . DILATION AND CURETTAGE, DIAGNOSTIC / THERAPEUTIC    . TUBAL LIGATION      OB History    No data available       Home Medications    Prior to Admission medications   Medication Sig Start Date End Date Taking? Authorizing Provider  diphenhydrAMINE (BENADRYL) 25 MG tablet Take 25 mg by mouth 2 (two) times daily.   Yes Historical Provider, MD  escitalopram (LEXAPRO) 20 MG tablet Take 20 mg by mouth at bedtime.   Yes Historical Provider, MD  Vitamin D, Ergocalciferol, (DRISDOL) 50000 units CAPS capsule Take 50,000 Units by mouth every 7 (seven) days. MONDAYS   Yes Historical Provider, MD  albuterol (PROVENTIL HFA;VENTOLIN HFA) 108 (90 Base) MCG/ACT inhaler Inhale 2 puffs into the lungs every 6 (six) hours as needed for wheezing or shortness of breath.    Historical Provider, MD  Estradiol 10 MCG TABS vaginal tablet Place 1 tablet  vaginally 2 (two) times a week.    Historical Provider, MD  fexofenadine-pseudoephedrine (ALLEGRA-D ALLERGY & CONGESTION) 180-240 MG 24 hr tablet Take 1 tablet by mouth daily. 07/29/16   Hassan RowanEugene Reanna Scoggin, MD  HYDROcodone-acetaminophen (NORCO) 5-325 MG tablet Take 1-2 tablets by mouth every 4 (four) hours as needed for moderate pain. 04/09/16   Nadeen LandauJarvis Wilton Smith, MD  hydrocortisone 2.5 % cream Apply 1 application topically 2 (two) times daily. FOR WART REMOVAL    Historical Provider, MD  meclizine (ANTIVERT) 25 MG tablet Take 1 tablet (25 mg total) by mouth 3 (three) times daily as needed for dizziness. 07/29/16   Hassan RowanEugene Helina Hullum, MD  ondansetron (ZOFRAN ODT) 8 MG disintegrating tablet Take 1 tablet (8 mg total) by mouth every 8 (eight) hours as needed for nausea or vomiting. 07/29/16   Hassan RowanEugene Jaquetta Currier, MD  predniSONE (STERAPRED UNI-PAK 21 TAB) 10 MG (21) TBPK tablet Sig 6 tablet day 1, 5 tablets day 2, 4 tablets day 3,,3tablets day 4, 2 tablets day 5, 1 tablet day 6 take all tablets orally 07/29/16   Hassan RowanEugene Bethanne Mule, MD    Family History Family History  Problem Relation Age of Onset  . Heart attack Father   . Breast cancer Neg Hx     Social History Social  History  Substance Use Topics  . Smoking status: Never Smoker  . Smokeless tobacco: Never Used  . Alcohol use Yes     Comment: OCC     Allergies   Other   Review of Systems Review of Systems   Physical Exam Triage Vital Signs ED Triage Vitals  Enc Vitals Group     BP 07/29/16 1210 (!) 159/75     Pulse Rate 07/29/16 1210 78     Resp 07/29/16 1210 18     Temp 07/29/16 1210 98.1 F (36.7 C)     Temp Source 07/29/16 1210 Oral     SpO2 07/29/16 1210 94 %     Weight 07/29/16 1211 200 lb (90.7 kg)     Height 07/29/16 1211 5\' 1"  (1.549 m)     Head Circumference --      Peak Flow --      Pain Score 07/29/16 1218 0     Pain Loc --      Pain Edu? --      Excl. in GC? --    No data found.   Updated Vital Signs BP (!) 159/75 (BP Location: Left  Arm)   Pulse 78   Temp 98.1 F (36.7 C) (Oral)   Resp 18   Ht 5\' 1"  (1.549 m)   Wt 200 lb (90.7 kg)   SpO2 94%   BMI 37.79 kg/m   Visual Acuity Right Eye Distance:   Left Eye Distance:   Bilateral Distance:    Right Eye Near:   Left Eye Near:    Bilateral Near:     Physical Exam   UC Treatments / Results  Labs (all labs ordered are listed, but only abnormal results are displayed) Labs Reviewed - No data to display  EKG  EKG Interpretation None       Radiology No results found.  Procedures Procedures (including critical care time)  Medications Ordered in UC Medications - No data to display   Initial Impression / Assessment and Plan / UC Course  I have reviewed the triage vital signs and the nursing notes.  Pertinent labs & imaging results that were available during my care of the patient were reviewed by me and considered in my medical decision making (see chart for details).     Patient will be placed on prednisone for 6 days see if that helps with the swelling and congestion also will place her on Antivert for the dizziness Zofran for nausea and Allegra-D for the congestion as well. If things not better in next 3-7 days strongly suggest she go to see her PCP but they get acutely worse to go to the ED of her choice.LM  Final Clinical Impressions(s) / UC Diagnoses   Final diagnoses:  Dizziness  Vertigo    New Prescriptions Discharge Medication List as of 07/29/2016  2:23 PM    START taking these medications   Details  fexofenadine-pseudoephedrine (ALLEGRA-D ALLERGY & CONGESTION) 180-240 MG 24 hr tablet Take 1 tablet by mouth daily., Starting Thu 07/29/2016, Normal    meclizine (ANTIVERT) 25 MG tablet Take 1 tablet (25 mg total) by mouth 3 (three) times daily as needed for dizziness., Starting Thu 07/29/2016, Normal    ondansetron (ZOFRAN ODT) 8 MG disintegrating tablet Take 1 tablet (8 mg total) by mouth every 8 (eight) hours as needed for nausea or  vomiting., Starting Thu 07/29/2016, Normal    predniSONE (STERAPRED UNI-PAK 21 TAB) 10 MG (21) TBPK tablet Sig 6 tablet  day 1, 5 tablets day 2, 4 tablets day 3,,3tablets day 4, 2 tablets day 5, 1 tablet day 6 take all tablets orally, Normal         .   Hassan Rowan, MD 07/29/16 [phone]

## 2016-07-29 NOTE — ED Triage Notes (Signed)
Pt reports of having dizziness starting 2 weeks ago. Pt does have a history of Vertigo.   Larose HiresAllison Belvin, SMA

## 2017-02-14 ENCOUNTER — Other Ambulatory Visit: Payer: Self-pay | Admitting: Family Medicine

## 2017-02-14 DIAGNOSIS — Z1231 Encounter for screening mammogram for malignant neoplasm of breast: Secondary | ICD-10-CM

## 2017-04-07 ENCOUNTER — Ambulatory Visit
Admission: RE | Admit: 2017-04-07 | Discharge: 2017-04-07 | Disposition: A | Discharge status: Home / Self Care | Payer: Medicare Other | Source: Ambulatory Visit | Attending: Family Medicine | Admitting: Family Medicine

## 2017-04-07 DIAGNOSIS — Z1231 Encounter for screening mammogram for malignant neoplasm of breast: Secondary | ICD-10-CM | POA: Insufficient documentation

## 2018-03-03 ENCOUNTER — Other Ambulatory Visit: Payer: Self-pay | Admitting: Family Medicine

## 2018-03-03 DIAGNOSIS — Z1231 Encounter for screening mammogram for malignant neoplasm of breast: Secondary | ICD-10-CM

## 2018-04-10 ENCOUNTER — Ambulatory Visit
Admission: RE | Admit: 2018-04-10 | Discharge: 2018-04-10 | Disposition: A | Discharge status: Home / Self Care | Payer: Medicare Other | Source: Ambulatory Visit | Attending: Family Medicine | Admitting: Family Medicine

## 2018-04-10 ENCOUNTER — Encounter (INDEPENDENT_AMBULATORY_CARE_PROVIDER_SITE_OTHER): Payer: Self-pay

## 2018-04-10 DIAGNOSIS — Z1231 Encounter for screening mammogram for malignant neoplasm of breast: Secondary | ICD-10-CM | POA: Insufficient documentation

## 2019-03-21 ENCOUNTER — Other Ambulatory Visit: Payer: Self-pay | Admitting: Family Medicine

## 2019-03-21 DIAGNOSIS — Z1231 Encounter for screening mammogram for malignant neoplasm of breast: Secondary | ICD-10-CM

## 2019-04-23 ENCOUNTER — Other Ambulatory Visit: Payer: Self-pay

## 2019-04-23 ENCOUNTER — Ambulatory Visit
Admission: RE | Admit: 2019-04-23 | Discharge: 2019-04-23 | Disposition: A | Discharge status: Home / Self Care | Payer: Medicare Other | Source: Ambulatory Visit | Attending: Family Medicine | Admitting: Family Medicine

## 2019-04-23 DIAGNOSIS — Z1231 Encounter for screening mammogram for malignant neoplasm of breast: Secondary | ICD-10-CM | POA: Diagnosis not present

## 2019-05-06 DIAGNOSIS — M1711 Unilateral primary osteoarthritis, right knee: Secondary | ICD-10-CM | POA: Insufficient documentation

## 2019-05-06 DIAGNOSIS — G5710 Meralgia paresthetica, unspecified lower limb: Secondary | ICD-10-CM | POA: Insufficient documentation

## 2019-05-06 DIAGNOSIS — R519 Headache, unspecified: Secondary | ICD-10-CM | POA: Insufficient documentation

## 2019-08-27 NOTE — Discharge Instructions (Signed)
Instructions after Total Knee Replacement   Hermilo Dutter P. Alissah Redmon, Jr., M.D.     Dept. of Orthopaedics & Sports Medicine  [facility]  [address]  [place], [place]  [address]  Phone: [phone]   Fax: [phone]    DIET: Drink plenty of non-alcoholic fluids. Resume your normal diet. Include foods high in fiber.  ACTIVITY:  You may use crutches or a walker with weight-bearing as tolerated, unless instructed otherwise. You may be weaned off of the walker or crutches by your Physical Therapist.  Do NOT place pillows under the knee. Anything placed under the knee could limit your ability to straighten the knee.   Continue doing gentle exercises. Exercising will reduce the pain and swelling, increase motion, and prevent muscle weakness.   Please continue to use the TED compression stockings for 6 weeks. You may remove the stockings at night, but should reapply them in the morning. Do not drive or operate any equipment until instructed.  WOUND CARE:  Continue to use the PolarCare or ice packs periodically to reduce pain and swelling. You may bathe or shower after the staples are removed at the first office visit following surgery.  MEDICATIONS: You may resume your regular medications. Please take the pain medication as prescribed on the medication. Do not take pain medication on an empty stomach. You have been given a prescription for a blood thinner (Lovenox or Coumadin). Please take the medication as instructed. (NOTE: After completing a 2 week course of Lovenox, take one Enteric-coated aspirin once a day. This along with elevation will help reduce the possibility of phlebitis in your operated leg.) Do not drive or drink alcoholic beverages when taking pain medications.  CALL THE OFFICE FOR: Temperature above 101 degrees Excessive bleeding or drainage on the dressing. Excessive swelling, coldness, or paleness of the toes. Persistent nausea and vomiting.  FOLLOW-UP:  You  should have an appointment to return to the office in 10-14 days after surgery. Arrangements have been made for continuation of Physical Therapy (either home therapy or outpatient therapy).   [facility] Department Directory         [email or web address]       [email or web address]          Cardiology  Appointments: [place] - [phone] [place] - [phone]  Endocrinology  Appointments: [place] - [phone] [place] - [phone]  Gastroenterology  Appointments: [place] - [phone] [place] - [phone]        General Surgery   Appointments: [place] - [phone]  Internal Medicine/Family Medicine  Appointments: [place] - [phone] [place] - [phone] [place] - [phone]  Metabolic and Weigh Loss Surgery  Appointments: [place] - [phone]        Neurology  Appointments: [place] - [phone] [place] - [phone]  Neurosurgery  Appointments: [place] - [phone]  Obstetrics & Gynecology  Appointments: [place] - [phone] [place] - [phone]        Pediatrics  Appointments: [place] - [phone] [place] - [phone]  Physiatry  Appointments: [place] -[phone]  Physical Therapy  Appointments: [place] - [phone] [place] - [phone]        Podiatry  Appointments: [place] - [phone] [place] - [phone]  Pulmonology  Appointments: [place] - [phone]  Rheumatology  Appointments: [place] - [phone]        [place] Location: [facility]  [address] , [place]  [address]  [place] Location: [facility] [address] [place], [place]  [address]  [place] Location: [facility] [address] [place], [place]  [address]    

## 2019-08-30 ENCOUNTER — Other Ambulatory Visit: Payer: Self-pay

## 2019-08-30 ENCOUNTER — Encounter
Admission: RE | Admit: 2019-08-30 | Discharge: 2019-08-30 | Disposition: A | Discharge status: Home / Self Care | Payer: Medicare Other | Source: Ambulatory Visit | Attending: Orthopedic Surgery | Admitting: Orthopedic Surgery

## 2019-08-30 NOTE — Patient Instructions (Signed)
Your procedure is scheduled on: Mon. 4/19 Report to Day Surgery.[facility] To find out your arrival time please call [phone] between 1PM - 3PM on .4/16  Remember: Instructions that are not followed completely may result in serious medical risk,  up to and including death, or upon the discretion of your surgeon and anesthesiologist your  surgery may need to be rescheduled.     _X__ 1. Do not eat food after midnight the night before your procedure.                 No gum chewing or hard candies. You may drink clear liquids up to 2 hours                 before you are scheduled to arrive for your surgery- DO not drink clear                 liquids within 2 hours of the start of your surgery.                 Clear Liquids include:  water, apple juice without pulp, clear Gatorade, G2 or                  Gatorade Zero (avoid Red/Purple/Blue), Black Coffee or Tea (Do not add                 anything to coffee or tea). __x___2.   Complete the carbohydrate drink provided to you, 2 hours before arrival.  __X__2.  On the morning of surgery brush your teeth with toothpaste and water, you                may rinse your mouth with mouthwash if you wish.  Do not swallow any toothpaste of mouthwash.     _X__ 3.  No Alcohol for 24 hours before or after surgery.   ___ 4.  Do Not Smoke or use e-cigarettes For 24 Hours Prior to Your Surgery.                 Do not use any chewable tobacco products for at least 6 hours prior to                 surgery.  ____  5.  Bring all medications with you on the day of surgery if instructed.   _x___  6.  Notify your doctor if there is any change in your medical condition      (cold, fever, infections).     Do not wear jewelry, make-up, hairpins, clips or nail polish. Do not wear lotions, powders, or perfumes.  Do not shave 48 hours prior to surgery.  Do not bring valuables to the hospital.    [facility] is not responsible for any  belongings or valuables.  Contacts, dentures or bridgework may not be worn into surgery. Leave your suitcase in the car. After surgery it may be brought to your room. For patients admitted to the hospital, discharge time is determined by your treatment team.   Patients discharged the day of surgery will not be allowed to drive home.   Make arrangements for someone to be with you for the first 24 hours of your Same Day Discharge.    Please read over the following fact sheets that you were given:     __x__ Take these medicines the morning of surgery with A SIP OF WATER:    1. diphenhydrAMINE (BENADRYL) 25 MG tablet if  needed  2.   3.   4.  5.  6.  ____ Fleet Enema (as directed)   _x___ Use CHG Soap (or wipes) as directed  ____ Use Benzoyl Peroxide Gel as instructed  __x__ Use inhalers on the day of surgery albuterol (PROVENTIL HFA;VENTOLIN HFA) 108 (90 Base) MCG/ACT inhaler and bring it with you.Fluticasone-Salmeterol (ADVAIR) 250-50 MCG/DOSE AEPB  ____ Stop metformin 2 days prior to surgery    ____ Take 1/2 of usual insulin dose the night before surgery. No insulin the morning          of surgery.   ____ Stop Coumadin/Plavix/aspirin   __x__ Stop Anti-inflammatories ibuprofen aleve or aspirin products on 4/12   ____ Stop supplements until after surgery.    __x__ Hospital will supply C-Pap while in the hospital.

## 2019-08-31 ENCOUNTER — Other Ambulatory Visit: Admission: RE | Admit: 2019-08-31 | Payer: Medicare Other | Source: Ambulatory Visit | Admitting: Orthopedic Surgery

## 2019-08-31 ENCOUNTER — Other Ambulatory Visit
Admission: RE | Admit: 2019-08-31 | Discharge: 2019-08-31 | Disposition: A | Discharge status: Home / Self Care | Payer: Medicare Other | Source: Ambulatory Visit | Attending: Orthopedic Surgery | Admitting: Orthopedic Surgery

## 2019-08-31 DIAGNOSIS — Z01818 Encounter for other preprocedural examination: Secondary | ICD-10-CM | POA: Insufficient documentation

## 2019-08-31 LAB — CBC
HCT: 41.8 % (ref 36.0–46.0)
Hemoglobin: 13.6 g/dL (ref 12.0–15.0)
MCH: 27.3 pg (ref 26.0–34.0)
MCHC: 32.5 g/dL (ref 30.0–36.0)
MCV: 83.9 fL (ref 80.0–100.0)
Platelets: 157 10*3/uL (ref 150–400)
RBC: 4.98 MIL/uL (ref 3.87–5.11)
RDW: 15.2 % (ref 11.5–15.5)
WBC: 3.7 10*3/uL — ABNORMAL LOW (ref 4.0–10.5)
nRBC: 0 % (ref 0.0–0.2)

## 2019-08-31 LAB — COMPREHENSIVE METABOLIC PANEL
ALT: 14 U/L (ref 0–44)
AST: 16 U/L (ref 15–41)
Albumin: 4.6 g/dL (ref 3.5–5.0)
Alkaline Phosphatase: 60 U/L (ref 38–126)
Anion gap: 9 (ref 5–15)
BUN: 12 mg/dL (ref 8–23)
CO2: 24 mmol/L (ref 22–32)
Calcium: 9.2 mg/dL (ref 8.9–10.3)
Chloride: 107 mmol/L (ref 98–111)
Creatinine, Ser: 0.6 mg/dL (ref 0.44–1.00)
GFR calc Af Amer: >60 mL/min (ref >60)
GFR calc non Af Amer: >60 mL/min (ref >60)
Glucose, Bld: 90 mg/dL (ref 70–99)
Potassium: 3.6 mmol/L (ref 3.5–5.1)
Sodium: 140 mmol/L (ref 135–145)
Total Bilirubin: 0.7 mg/dL (ref 0.3–1.2)
Total Protein: 7.8 g/dL (ref 6.5–8.1)

## 2019-08-31 LAB — TYPE AND SCREEN
ABO/RH(D): O POS
Antibody Screen: NEGATIVE

## 2019-08-31 LAB — URINALYSIS, ROUTINE W REFLEX MICROSCOPIC
Bilirubin Urine: NEGATIVE
Glucose, UA: NEGATIVE mg/dL
Hgb urine dipstick: NEGATIVE
Ketones, ur: NEGATIVE mg/dL
Leukocytes,Ua: NEGATIVE
Nitrite: NEGATIVE
Protein, ur: NEGATIVE mg/dL
Specific Gravity, Urine: 1.015 (ref 1.005–1.030)
pH: 5 (ref 5.0–8.0)

## 2019-08-31 LAB — PROTIME-INR
INR: 1 (ref 0.8–1.2)
Prothrombin Time: 13 seconds (ref 11.4–15.2)

## 2019-08-31 LAB — APTT: aPTT: 30 seconds (ref 24–36)

## 2019-08-31 LAB — C-REACTIVE PROTEIN: CRP: 0.6 mg/dL (ref <1.0)

## 2019-08-31 LAB — SEDIMENTATION RATE: Sed Rate: 10 mm/hr (ref 0–30)

## 2019-09-01 LAB — URINE CULTURE
Culture: NO GROWTH
Special Requests: NORMAL

## 2019-09-06 ENCOUNTER — Other Ambulatory Visit
Admission: RE | Admit: 2019-09-06 | Discharge: 2019-09-06 | Disposition: A | Discharge status: Home / Self Care | Payer: Medicare Other | Source: Ambulatory Visit | Attending: Orthopedic Surgery | Admitting: Orthopedic Surgery

## 2019-09-06 ENCOUNTER — Other Ambulatory Visit: Payer: Self-pay

## 2019-09-06 DIAGNOSIS — Z20822 Contact with and (suspected) exposure to covid-19: Secondary | ICD-10-CM | POA: Insufficient documentation

## 2019-09-06 DIAGNOSIS — Z01812 Encounter for preprocedural laboratory examination: Secondary | ICD-10-CM | POA: Diagnosis present

## 2019-09-06 LAB — SURGICAL PCR SCREEN
MRSA, PCR: NEGATIVE
Staphylococcus aureus: POSITIVE — AB

## 2019-09-06 LAB — SARS CORONAVIRUS 2 (TAT 6-24 HRS): SARS Coronavirus 2: NEGATIVE

## 2019-09-09 ENCOUNTER — Encounter: Payer: Self-pay | Admitting: Orthopedic Surgery

## 2019-09-09 NOTE — H&P (Signed)
ORTHOPAEDIC HISTORY & PHYSICAL  Progress Notes Michelene Gardener, [place] - 08/31/2019 10:45 AM EDT [facility] Chief Complaint:   Chief Complaint  Patient presents with  . Right Knee - Follow-up, Pain, Pre Op Consulting   History of Present Illness:   Jacqueline Miller is a 74 y.o. female that presents to clinic today for her preoperative history and evaluation. Patient presents unaccompanied. The patient is scheduled to undergo a right total knee arthroplasty on 09/10/19 by Dr. Ernest Pine. Her pain began longer than 5 years ago. The pain is located primarily along the medial aspect of the knee. She describes her pain as worsened with weightbearing. She reports associated swelling and some giving way of the knee. She denies associated numbness or tingling, denies any locking of the knee.   The patient's symptoms have progressed to the point that they decrease her quality of life. The patient has previously undergone conservative treatment including NSAIDS and injections to the knee without adequate control of her symptoms.  Denies cardiac history, history of blood clots, history of back surgery.   Patient would like to have her physical therapy done at [facility] in [place]. She requests an order for physical therapy.  Past Medical, Surgical, Family, Social History, Allergies, Medications:   Past Medical History:  Past Medical History:  Diagnosis Date  . Allergic state  . Anxiety  . Arthritis  . Asthma without status asthmaticus, unspecified  . Mixed hyperlipidemia  . Neuropathy  10% service connected  . PTSD (post-traumatic stress disorder)  30% connected  . Sleep apnea   Past Surgical History:  Past Surgical History:  Procedure Laterality Date  . CHOLECYSTECTOMY 2016  . COLONOSCOPY W/BIOPSY N/A 09/21/2017  Procedure: COLONOSCOPY, FLEXIBLE; WITH BIOPSY, SINGLE OR MULTIPLE; Surgeon: Lavada Mesi, MD; Location: [facility] ENDO/BRON[facility];  Service: Gastroenterology; Laterality: N/A;  . COLONOSCOPY W/REMOVAL LESIONS BY SNARE N/A 09/21/2017  Procedure: COLONOSCOPY, FLEXIBLE; WITH REMOVAL OF TUMOR(S), POLYP(S), OR OTHER LESION(S) BY SNARE TECHNIQUE; Surgeon: Lavada Mesi, MD; Location: [facility] ENDO/[facility]; Service: Gastroenterology; Laterality: N/A;  . TUBAL LIGATION   Current Medications:  Current Outpatient Medications  Medication Sig Dispense Refill  . albuterol (PROAIR HFA) 90 mcg/actuation inhaler Inhale 2 inhalations into the lungs every 6 (six) hours as needed for Wheezing 25.5 g 3  . calcipotriene (DOVONEX) 0.005 % cream Apply 1 Application topically 2 (two) times daily  . clobetasoL (CLOBEX) 0.05 % shampoo Apply topically once daily 118 mL 5  . clotrimazole-betamethasone (LOTRISONE) 1-0.05 % cream Apply topically  . diphenhydrAMINE (BENADRYL) 25 mg tablet Take by mouth  . ergocalciferol, vitamin D2, 1,250 mcg (50,000 unit) capsule Take 1 capsule (50,000 Units total) by mouth once a week 12 capsule 1  . escitalopram oxalate (LEXAPRO) 20 MG tablet Take 1 tablet (20 mg total) by mouth once daily 90 tablet 3  . estradiol in acid mantle 0.01% vaginal cream Place 1 g vaginally twice a week  . fluticasone propion-salmeteroL (ADVAIR DISKUS) 250-50 mcg/dose diskus inhaler Inhale 1 inhalation into the lungs every 12 (twelve) hours 3 Inhaler 12  . UNABLE TO FIND CPAP machine  . ENSTILAR 0.005-0.064 % Foam Apply 1 Application topically once daily Use as directed   No current facility-administered medications for this visit.   Allergies:  Allergies  Allergen Reactions  . Tetracycline Headache  . Tree Nuts Nausea   Social History:  Social History   Socioeconomic History  . Marital status: Married  Spouse name: Brett Canales  . Number of  children: 0  . Years of education: 26  . Highest education level: Not on file  Occupational History  . Occupation: Retired  Tobacco Use  . Smoking status: Former Smoker  Packs/day: 0.25   Years: 0.50  Pack years: 0.12  Quit date: 1967  Years since quitting: 54.2  . Smokeless tobacco: Never Used  Vaping Use  . Vaping Use: Never used  Substance and Sexual Activity  . Alcohol use: Yes  Alcohol/week: 2.0 standard drinks  Types: 2 Glasses of wine per week  Comment: Not never week  . Drug use: Never  . Sexual activity: Yes  Partners: Male  Birth control/protection: Post-menopausal  Other Topics Concern  . Would you please tell us about the people who live in your home, your pets, or anything else important to your social life? No  Social History Narrative  . Not on file   Social Determinants of Health   Financial Resource Strain:  . Difficulty of Paying Living Expenses:  Food Insecurity:  . Worried About Charity fundraiser in the Last Year:  . Arboriculturist in the Last Year:  Transportation Needs:  . Film/video editor (Medical):  Marland Kitchen Lack of Transportation (Non-Medical):  Physical Activity:  . Days of Exercise per Week:  . Minutes of Exercise per Session:  Stress:  . Feeling of Stress :  Social Connections:  . Frequency of Communication with Friends and Family:  . Frequency of Social Gatherings with Friends and Family:  . Attends Religious Services:  . Active Member of Clubs or Organizations:  . Attends Archivist Meetings:  Marland Kitchen Marital Status:   Family History:  Family History  Problem Relation Age of Onset  . Osteoarthritis Mother  . Osteoporosis (Thinning of bones) Mother  . Other Mother  Dementia  . Coronary Artery Disease (Blocked arteries around heart) Father  . Diabetes type II Father  . Obesity Father  . Alzheimer's disease Maternal Grandmother  . Pancreatic cancer Paternal Grandmother  . Coronary Artery Disease (Blocked arteries around heart) Paternal Grandfather   Review of Systems:   A 10+ ROS was performed, reviewed, and the pertinent orthopaedic findings are documented in the HPI.   Physical Examination:   BP  118/68 (BP Location: Left upper arm, Patient Position: Sitting, BP Cuff Size: Adult)  Ht 152.4 cm (5')  Wt 83.5 kg (184 lb)  BMI 35.94 kg/m   Patient is a well-developed, well-nourished female in no acute distress. Patient has normal mood and affect. Patient is alert and oriented to person, place, and time.   HEENT: Atraumatic, normocephalic. Pupils equal and reactive to light. Extraocular motion intact. Noninjected sclera.  Cardiovascular: Regular rate and rhythm, with no murmurs, rubs, or gallops. Distal pulses palpable.  Respiratory: Lungs clear to auscultation bilaterally.   Right Knee: Soft tissue swelling: mild Effusion: none Erythema: none Crepitance: mild Tenderness: medial Alignment: relative varus Mediolateral laxity: medial pseudolaxity Posterior sag: negative Patellar tracking: Good tracking without evidence of subluxation or tilt Atrophy: No significant atrophy.  Quadriceps tone was good. Range of motion: 0/0/130 degrees   Sensation intact over the saphenous, lateral sural cutaneous, superficial fibular, and deep fibular nerve distributions.  Tests Performed/Reviewed:  X-rays  Anteroposterior, lateral, and sunrise views of the right knee were obtained. Images reveal moderate to severe loss of medial joint space with associated subchondral sclerosis of the bone and osteophyte formation. Lateral compartment reveals mild loss of joint space with osteophyte formation. Patellofemoral joint reveals mild loss of joint  space with osteophyte formation. No fractures or dislocations noted.  Impression:   ICD-10-CM  1. Primary osteoarthritis of right knee M17.11  2. Obesity, Class II, BMI 35-39.9, unspecified E66.9   Plan:   The patient has end-stage degenerative changes of the right knee. It was explained to the patient that the condition is progressive in nature. Having failed conservative treatment, the patient has elected to proceed with a total joint arthroplasty.  The patient will undergo a total joint arthroplasty with Dr. Ernest Pine. The risks of surgery, including blood clot and infection, were discussed with the patient. Measures to reduce these risks, including the use of anticoagulation, perioperative antibiotics, and early ambulation were discussed. The importance of postoperative physical therapy was discussed with the patient. The patient elects to proceed with surgery. The patient is instructed to stop all blood thinners prior to surgery. The patient is instructed to call the hospital the day before surgery to learn of the proper arrival time.   Order provided for physical therapy. Order to be faxed to the patient's therapy facility of choice.  Contact our office with any questions or concerns. Follow up as indicated, or sooner should any new problems arise, if conditions worsen, or if they are otherwise concerned.   Michelene Gardener, PA-C [facility] [address] [place], [place] [address] Phone: [phone]  This note was generated in part with voice recognition software and I apologize for any typographical errors that were not detected and corrected.   Electronically signed by Michelene Gardener, PA at 09/02/2019 10:47 PM EDT

## 2019-09-10 ENCOUNTER — Inpatient Hospital Stay
Admission: RE | Admit: 2019-09-10 | Discharge: 2019-09-12 | DRG: 470 | Disposition: A | Discharge status: Home Health | Payer: Medicare Other | Attending: Orthopedic Surgery | Admitting: Orthopedic Surgery

## 2019-09-10 ENCOUNTER — Other Ambulatory Visit: Payer: Self-pay

## 2019-09-10 ENCOUNTER — Inpatient Hospital Stay: Payer: Medicare Other | Admitting: Certified Registered"

## 2019-09-10 ENCOUNTER — Encounter: Payer: Self-pay | Admitting: Orthopedic Surgery

## 2019-09-10 ENCOUNTER — Encounter
Admission: RE | Disposition: A | Discharge status: Home Health | Payer: Self-pay | Source: Home / Self Care | Attending: Orthopedic Surgery

## 2019-09-10 ENCOUNTER — Inpatient Hospital Stay: Payer: Medicare Other

## 2019-09-10 DIAGNOSIS — K219 Gastro-esophageal reflux disease without esophagitis: Secondary | ICD-10-CM | POA: Diagnosis present

## 2019-09-10 DIAGNOSIS — Z6835 Body mass index (BMI) 35.0-35.9, adult: Secondary | ICD-10-CM | POA: Diagnosis not present

## 2019-09-10 DIAGNOSIS — Z20822 Contact with and (suspected) exposure to covid-19: Secondary | ICD-10-CM | POA: Diagnosis present

## 2019-09-10 DIAGNOSIS — F4310 Post-traumatic stress disorder, unspecified: Secondary | ICD-10-CM | POA: Diagnosis present

## 2019-09-10 DIAGNOSIS — M1711 Unilateral primary osteoarthritis, right knee: Principal | ICD-10-CM | POA: Diagnosis present

## 2019-09-10 DIAGNOSIS — Z87891 Personal history of nicotine dependence: Secondary | ICD-10-CM | POA: Diagnosis not present

## 2019-09-10 DIAGNOSIS — J45909 Unspecified asthma, uncomplicated: Secondary | ICD-10-CM | POA: Diagnosis present

## 2019-09-10 DIAGNOSIS — E782 Mixed hyperlipidemia: Secondary | ICD-10-CM | POA: Diagnosis present

## 2019-09-10 DIAGNOSIS — G629 Polyneuropathy, unspecified: Secondary | ICD-10-CM | POA: Diagnosis present

## 2019-09-10 DIAGNOSIS — Z96651 Presence of right artificial knee joint: Secondary | ICD-10-CM

## 2019-09-10 DIAGNOSIS — Z9049 Acquired absence of other specified parts of digestive tract: Secondary | ICD-10-CM

## 2019-09-10 DIAGNOSIS — G4733 Obstructive sleep apnea (adult) (pediatric): Secondary | ICD-10-CM | POA: Diagnosis present

## 2019-09-10 DIAGNOSIS — E669 Obesity, unspecified: Secondary | ICD-10-CM | POA: Diagnosis present

## 2019-09-10 DIAGNOSIS — Z96659 Presence of unspecified artificial knee joint: Principal | ICD-10-CM

## 2019-09-10 HISTORY — PX: KNEE ARTHROPLASTY: SHX992

## 2019-09-10 LAB — ABO/RH: ABO/RH(D): O POS

## 2019-09-10 SURGERY — ARTHROPLASTY, KNEE, TOTAL, USING IMAGELESS COMPUTER-ASSISTED NAVIGATION
Anesthesia: Spinal | Site: Knee | Laterality: Right

## 2019-09-10 MED ORDER — CELECOXIB 200 MG PO CAPS
200.0000 mg | ORAL_CAPSULE | Freq: Two times a day (BID) | ORAL | Status: DC
  Administered 2019-09-10 – 2019-09-12 (×4): 200 mg via ORAL
  Filled 2019-09-10 (×4): qty 1

## 2019-09-10 MED ORDER — OXYCODONE HCL 5 MG PO TABS: 10.0000 mg | ORAL_TABLET | ORAL | Status: DC | PRN

## 2019-09-10 MED ORDER — BUPIVACAINE HCL (PF) 0.5 % IJ SOLN
INTRAMUSCULAR | Status: DC | PRN
  Administered 2019-09-10: 3 mL

## 2019-09-10 MED ORDER — ACETAMINOPHEN 325 MG PO TABS: 325.0000 mg | ORAL_TABLET | Freq: Four times a day (QID) | ORAL | Status: DC | PRN

## 2019-09-10 MED ORDER — CELECOXIB 200 MG PO CAPS
ORAL_CAPSULE | ORAL | Status: AC
  Administered 2019-09-10: 400 mg via ORAL
  Filled 2019-09-10: qty 2

## 2019-09-10 MED ORDER — LACTATED RINGERS IV SOLN: INTRAVENOUS | Status: DC

## 2019-09-10 MED ORDER — LACTATED RINGERS IV SOLN: INTRAVENOUS | Status: DC | PRN

## 2019-09-10 MED ORDER — BUPIVACAINE HCL (PF) 0.25 % IJ SOLN
INTRAMUSCULAR | Status: AC
  Filled 2019-09-10: qty 60

## 2019-09-10 MED ORDER — CALCIPOTRIENE-BETAMETH DIPROP 0.005-0.064 % EX FOAM: 1.0000 "application " | Freq: Every day | CUTANEOUS | Status: DC | PRN

## 2019-09-10 MED ORDER — BISACODYL 10 MG RE SUPP: 10.0000 mg | Freq: Every day | RECTAL | Status: DC | PRN

## 2019-09-10 MED ORDER — METOCLOPRAMIDE HCL 5 MG/ML IJ SOLN: 5.0000 mg | Freq: Three times a day (TID) | INTRAMUSCULAR | Status: DC | PRN

## 2019-09-10 MED ORDER — TRAMADOL HCL 50 MG PO TABS
50.0000 mg | ORAL_TABLET | ORAL | Status: DC | PRN
  Administered 2019-09-11 – 2019-09-12 (×2): 50 mg via ORAL
  Filled 2019-09-10 (×2): qty 1

## 2019-09-10 MED ORDER — OLOPATADINE HCL 0.1 % OP SOLN
1.0000 [drp] | Freq: Every day | OPHTHALMIC | Status: DC | PRN
  Filled 2019-09-10: qty 5

## 2019-09-10 MED ORDER — CEFAZOLIN SODIUM-DEXTROSE 2-4 GM/100ML-% IV SOLN
2.0000 g | INTRAVENOUS | Status: AC
  Administered 2019-09-10: 2 g via INTRAVENOUS

## 2019-09-10 MED ORDER — ALUM & MAG HYDROXIDE-SIMETH 200-200-20 MG/5ML PO SUSP: 30.0000 mL | ORAL | Status: DC | PRN

## 2019-09-10 MED ORDER — CEFAZOLIN SODIUM-DEXTROSE 2-4 GM/100ML-% IV SOLN
INTRAVENOUS | Status: AC
  Administered 2019-09-11: 2 g via INTRAVENOUS
  Filled 2019-09-10: qty 100

## 2019-09-10 MED ORDER — MIDAZOLAM HCL 5 MG/5ML IJ SOLN
INTRAMUSCULAR | Status: DC | PRN
  Administered 2019-09-10 (×2): 1 mg via INTRAVENOUS

## 2019-09-10 MED ORDER — GABAPENTIN 300 MG PO CAPS
ORAL_CAPSULE | ORAL | Status: AC
  Administered 2019-09-10: 300 mg via ORAL
  Filled 2019-09-10: qty 1

## 2019-09-10 MED ORDER — GLYCOPYRROLATE 0.2 MG/ML IJ SOLN
INTRAMUSCULAR | Status: AC
  Filled 2019-09-10: qty 1

## 2019-09-10 MED ORDER — MOMETASONE FURO-FORMOTEROL FUM 200-5 MCG/ACT IN AERO
2.0000 | INHALATION_SPRAY | Freq: Two times a day (BID) | RESPIRATORY_TRACT | Status: DC
  Administered 2019-09-10 – 2019-09-12 (×4): 2 via RESPIRATORY_TRACT
  Filled 2019-09-10: qty 8.8

## 2019-09-10 MED ORDER — SODIUM CHLORIDE 0.9 % IV SOLN
INTRAVENOUS | Status: DC | PRN
  Administered 2019-09-10: 30 ug/min via INTRAVENOUS

## 2019-09-10 MED ORDER — PANTOPRAZOLE SODIUM 40 MG PO TBEC
40.0000 mg | DELAYED_RELEASE_TABLET | Freq: Two times a day (BID) | ORAL | Status: DC
  Administered 2019-09-10 – 2019-09-12 (×4): 40 mg via ORAL
  Filled 2019-09-10 (×4): qty 1

## 2019-09-10 MED ORDER — ACETAMINOPHEN 10 MG/ML IV SOLN
INTRAVENOUS | Status: AC
  Filled 2019-09-10: qty 100

## 2019-09-10 MED ORDER — PROPOFOL 500 MG/50ML IV EMUL
INTRAVENOUS | Status: AC
  Filled 2019-09-10: qty 50

## 2019-09-10 MED ORDER — MENTHOL 3 MG MT LOZG
1.0000 | LOZENGE | OROMUCOSAL | Status: DC | PRN
  Filled 2019-09-10: qty 9

## 2019-09-10 MED ORDER — ONDANSETRON HCL 4 MG PO TABS: 4.0000 mg | ORAL_TABLET | Freq: Four times a day (QID) | ORAL | Status: DC | PRN

## 2019-09-10 MED ORDER — PROPOFOL 10 MG/ML IV BOLUS
INTRAVENOUS | Status: AC
  Filled 2019-09-10: qty 20

## 2019-09-10 MED ORDER — DEXMEDETOMIDINE HCL IN NACL 80 MCG/20ML IV SOLN
INTRAVENOUS | Status: AC
  Filled 2019-09-10: qty 20

## 2019-09-10 MED ORDER — FERROUS SULFATE 325 (65 FE) MG PO TABS
325.0000 mg | ORAL_TABLET | Freq: Two times a day (BID) | ORAL | Status: DC
  Administered 2019-09-10 – 2019-09-12 (×3): 325 mg via ORAL
  Filled 2019-09-10 (×3): qty 1

## 2019-09-10 MED ORDER — ONDANSETRON HCL 4 MG/2ML IJ SOLN: 4.0000 mg | Freq: Once | INTRAMUSCULAR | Status: DC | PRN

## 2019-09-10 MED ORDER — METOCLOPRAMIDE HCL 10 MG PO TABS
10.0000 mg | ORAL_TABLET | Freq: Three times a day (TID) | ORAL | Status: DC
  Administered 2019-09-10 – 2019-09-12 (×7): 10 mg via ORAL
  Filled 2019-09-10 (×6): qty 1

## 2019-09-10 MED ORDER — CELECOXIB 200 MG PO CAPS: 400.0000 mg | ORAL_CAPSULE | Freq: Once | ORAL | Status: AC

## 2019-09-10 MED ORDER — DEXAMETHASONE SODIUM PHOSPHATE 10 MG/ML IJ SOLN
INTRAMUSCULAR | Status: AC
  Administered 2019-09-10: 8 mg via INTRAVENOUS
  Filled 2019-09-10: qty 1

## 2019-09-10 MED ORDER — HYDROMORPHONE HCL 1 MG/ML IJ SOLN: 0.5000 mg | INTRAMUSCULAR | Status: DC | PRN

## 2019-09-10 MED ORDER — METOCLOPRAMIDE HCL 10 MG PO TABS
5.0000 mg | ORAL_TABLET | Freq: Three times a day (TID) | ORAL | Status: DC | PRN
  Filled 2019-09-10: qty 1

## 2019-09-10 MED ORDER — OXYCODONE HCL 5 MG PO TABS: 5.0000 mg | ORAL_TABLET | ORAL | Status: DC | PRN

## 2019-09-10 MED ORDER — GLYCOPYRROLATE 0.2 MG/ML IJ SOLN
INTRAMUSCULAR | Status: DC | PRN
  Administered 2019-09-10 (×2): .1 mg via INTRAVENOUS

## 2019-09-10 MED ORDER — CEFAZOLIN SODIUM-DEXTROSE 2-4 GM/100ML-% IV SOLN
2.0000 g | Freq: Four times a day (QID) | INTRAVENOUS | Status: AC
  Administered 2019-09-10 – 2019-09-11 (×3): 2 g via INTRAVENOUS
  Filled 2019-09-10 (×4): qty 100

## 2019-09-10 MED ORDER — ONDANSETRON HCL 4 MG/2ML IJ SOLN: 4.0000 mg | Freq: Four times a day (QID) | INTRAMUSCULAR | Status: DC | PRN

## 2019-09-10 MED ORDER — MAGNESIUM HYDROXIDE 400 MG/5ML PO SUSP
30.0000 mL | Freq: Every day | ORAL | Status: DC
  Administered 2019-09-11 – 2019-09-12 (×2): 30 mL via ORAL
  Filled 2019-09-10 (×2): qty 30

## 2019-09-10 MED ORDER — FLEET ENEMA 7-19 GM/118ML RE ENEM: 1.0000 | ENEMA | Freq: Once | RECTAL | Status: DC | PRN

## 2019-09-10 MED ORDER — DEXMEDETOMIDINE HCL IN NACL 400 MCG/100ML IV SOLN
INTRAVENOUS | Status: DC | PRN
  Administered 2019-09-10 (×2): 4 ug via INTRAVENOUS

## 2019-09-10 MED ORDER — BUPIVACAINE HCL (PF) 0.25 % IJ SOLN
INTRAMUSCULAR | Status: DC | PRN
  Administered 2019-09-10: 60 mL

## 2019-09-10 MED ORDER — SENNOSIDES-DOCUSATE SODIUM 8.6-50 MG PO TABS
1.0000 | ORAL_TABLET | Freq: Two times a day (BID) | ORAL | Status: DC
  Administered 2019-09-10 – 2019-09-12 (×4): 1 via ORAL
  Filled 2019-09-10 (×3): qty 1

## 2019-09-10 MED ORDER — FENTANYL CITRATE (PF) 100 MCG/2ML IJ SOLN: 25.0000 ug | INTRAMUSCULAR | Status: DC | PRN

## 2019-09-10 MED ORDER — TRANEXAMIC ACID-NACL 1000-0.7 MG/100ML-% IV SOLN
INTRAVENOUS | Status: AC
  Filled 2019-09-10: qty 100

## 2019-09-10 MED ORDER — SODIUM CHLORIDE FLUSH 0.9 % IV SOLN
INTRAVENOUS | Status: AC
  Filled 2019-09-10: qty 40

## 2019-09-10 MED ORDER — CLOTRIMAZOLE 1 % EX CREA
TOPICAL_CREAM | Freq: Two times a day (BID) | CUTANEOUS | Status: DC
  Filled 2019-09-10 (×2): qty 15

## 2019-09-10 MED ORDER — ENOXAPARIN SODIUM 30 MG/0.3ML ~~LOC~~ SOLN
30.0000 mg | Freq: Two times a day (BID) | SUBCUTANEOUS | Status: DC
  Administered 2019-09-10 – 2019-09-12 (×5): 30 mg via SUBCUTANEOUS
  Filled 2019-09-10 (×4): qty 0.3

## 2019-09-10 MED ORDER — TRANEXAMIC ACID-NACL 1000-0.7 MG/100ML-% IV SOLN: 1000.0000 mg | Freq: Once | INTRAVENOUS | Status: AC

## 2019-09-10 MED ORDER — CLOBETASOL PROPIONATE 0.05 % EX SHAM: 1.0000 "application " | MEDICATED_SHAMPOO | Freq: Every day | CUTANEOUS | Status: DC | PRN

## 2019-09-10 MED ORDER — ESCITALOPRAM OXALATE 10 MG PO TABS
10.0000 mg | ORAL_TABLET | Freq: Every day | ORAL | Status: DC
  Administered 2019-09-10 – 2019-09-11 (×2): 10 mg via ORAL
  Filled 2019-09-10 (×3): qty 1

## 2019-09-10 MED ORDER — ENSURE PRE-SURGERY PO LIQD
296.0000 mL | Freq: Once | ORAL | Status: AC
  Administered 2019-09-10: 296 mL via ORAL

## 2019-09-10 MED ORDER — SODIUM CHLORIDE 0.9 % IV SOLN: INTRAVENOUS | Status: DC

## 2019-09-10 MED ORDER — DIPHENHYDRAMINE HCL 25 MG PO CAPS: 25.0000 mg | ORAL_CAPSULE | Freq: Three times a day (TID) | ORAL | Status: DC | PRN

## 2019-09-10 MED ORDER — ALBUTEROL SULFATE (2.5 MG/3ML) 0.083% IN NEBU: 2.5000 mg | INHALATION_SOLUTION | Freq: Four times a day (QID) | RESPIRATORY_TRACT | Status: DC | PRN

## 2019-09-10 MED ORDER — TRANEXAMIC ACID-NACL 1000-0.7 MG/100ML-% IV SOLN
1000.0000 mg | INTRAVENOUS | Status: AC
  Administered 2019-09-10: 1000 mg via INTRAVENOUS

## 2019-09-10 MED ORDER — DEXAMETHASONE SODIUM PHOSPHATE 10 MG/ML IJ SOLN: 8.0000 mg | Freq: Once | INTRAMUSCULAR | Status: AC

## 2019-09-10 MED ORDER — CHLORHEXIDINE GLUCONATE 4 % EX LIQD
60.0000 mL | Freq: Once | CUTANEOUS | Status: AC
  Administered 2019-09-10: 4 via TOPICAL

## 2019-09-10 MED ORDER — NEOMYCIN-POLYMYXIN B GU 40-200000 IR SOLN
Status: AC
  Filled 2019-09-10: qty 20

## 2019-09-10 MED ORDER — FAMOTIDINE 20 MG PO TABS
ORAL_TABLET | ORAL | Status: AC
  Filled 2019-09-10: qty 1

## 2019-09-10 MED ORDER — BUPIVACAINE HCL (PF) 0.5 % IJ SOLN
INTRAMUSCULAR | Status: AC
  Filled 2019-09-10: qty 10

## 2019-09-10 MED ORDER — BUPIVACAINE LIPOSOME 1.3 % IJ SUSP
INTRAMUSCULAR | Status: AC
  Filled 2019-09-10: qty 20

## 2019-09-10 MED ORDER — SODIUM CHLORIDE 0.9 % IV SOLN
INTRAVENOUS | Status: DC | PRN
  Administered 2019-09-10: 11:00:00 60 mL

## 2019-09-10 MED ORDER — CALCIPOTRIENE 0.005 % EX SOLN: 1.0000 "application " | Freq: Two times a day (BID) | CUTANEOUS | Status: DC | PRN

## 2019-09-10 MED ORDER — FAMOTIDINE 20 MG PO TABS
20.0000 mg | ORAL_TABLET | Freq: Once | ORAL | Status: AC
  Administered 2019-09-10: 20 mg via ORAL

## 2019-09-10 MED ORDER — PHENOL 1.4 % MT LIQD
1.0000 | OROMUCOSAL | Status: DC | PRN
  Filled 2019-09-10: qty 177

## 2019-09-10 MED ORDER — ACETAMINOPHEN 10 MG/ML IV SOLN
1000.0000 mg | Freq: Four times a day (QID) | INTRAVENOUS | Status: AC
  Administered 2019-09-10 – 2019-09-11 (×4): 1000 mg via INTRAVENOUS
  Filled 2019-09-10 (×4): qty 100

## 2019-09-10 MED ORDER — GABAPENTIN 300 MG PO CAPS
300.0000 mg | ORAL_CAPSULE | Freq: Every day | ORAL | Status: DC
  Administered 2019-09-10 – 2019-09-11 (×2): 300 mg via ORAL
  Filled 2019-09-10 (×2): qty 1

## 2019-09-10 MED ORDER — TRANEXAMIC ACID-NACL 1000-0.7 MG/100ML-% IV SOLN
INTRAVENOUS | Status: AC
  Administered 2019-09-10: 1000 mg via INTRAVENOUS
  Filled 2019-09-10: qty 100

## 2019-09-10 MED ORDER — NEOMYCIN-POLYMYXIN B GU 40-200000 IR SOLN
Status: DC | PRN
  Administered 2019-09-10: 16 mL

## 2019-09-10 MED ORDER — DIPHENHYDRAMINE HCL 12.5 MG/5ML PO ELIX: 12.5000 mg | ORAL_SOLUTION | ORAL | Status: DC | PRN

## 2019-09-10 MED ORDER — GABAPENTIN 300 MG PO CAPS: 300.0000 mg | ORAL_CAPSULE | Freq: Once | ORAL | Status: AC

## 2019-09-10 MED ORDER — PROPOFOL 500 MG/50ML IV EMUL
INTRAVENOUS | Status: DC | PRN
  Administered 2019-09-10: 100 ug/kg/min via INTRAVENOUS

## 2019-09-10 MED ORDER — MIDAZOLAM HCL 2 MG/2ML IJ SOLN
INTRAMUSCULAR | Status: AC
  Filled 2019-09-10: qty 2

## 2019-09-10 MED ORDER — ALBUTEROL SULFATE HFA 108 (90 BASE) MCG/ACT IN AERS: 1.0000 | INHALATION_SPRAY | Freq: Four times a day (QID) | RESPIRATORY_TRACT | Status: DC | PRN

## 2019-09-10 SURGICAL SUPPLY — 75 items
ATTUNE MED DOME PAT 32 KNEE (Knees) ×1 IMPLANT
ATTUNE MED DOME PAT 32MM KNEE (Knees) ×1 IMPLANT
ATTUNE PSFEM RTSZ4 NARCEM KNEE (Femur) ×2 IMPLANT
ATTUNE PSRP INSR SZ4 5 KNEE (Insert) ×1 IMPLANT
ATTUNE PSRP INSR SZ4 5MM KNEE (Insert) ×1 IMPLANT
BASE TIBIAL ROT PLAT SZ 3 KNEE (Knees) IMPLANT
BATTERY INSTRU NAVIGATION (MISCELLANEOUS) ×12 IMPLANT
BLADE SAW 70X12.5 (BLADE) ×3 IMPLANT
BLADE SAW 90X13X1.19 OSCILLAT (BLADE) ×3 IMPLANT
BLADE SAW 90X25X1.19 OSCILLAT (BLADE) ×3 IMPLANT
CANISTER SUCT 3000ML PPV (MISCELLANEOUS) ×3 IMPLANT
CEMENT HV SMART SET (Cement) ×2 IMPLANT
COOLER POLAR GLACIER W/PUMP (MISCELLANEOUS) ×3 IMPLANT
COVER WAND RF STERILE (DRAPES) ×3 IMPLANT
CUFF TOURN SGL QUICK 24 (TOURNIQUET CUFF) ×2
CUFF TOURN SGL QUICK 30 (TOURNIQUET CUFF)
CUFF TRNQT CYL 24X4X16.5-23 (TOURNIQUET CUFF) IMPLANT
CUFF TRNQT CYL 30X4X21-28X (TOURNIQUET CUFF) IMPLANT
DRAPE 3/4 80X56 (DRAPES) ×3 IMPLANT
DRSG DERMACEA 8X12 NADH (GAUZE/BANDAGES/DRESSINGS) ×3 IMPLANT
DRSG OPSITE POSTOP 4X14 (GAUZE/BANDAGES/DRESSINGS) ×3 IMPLANT
DRSG TEGADERM 4X4.75 (GAUZE/BANDAGES/DRESSINGS) ×3 IMPLANT
DURAPREP 26ML APPLICATOR (WOUND CARE) ×4 IMPLANT
ELECT REM PT RETURN 9FT ADLT (ELECTROSURGICAL) ×3
ELECTRODE REM PT RTRN 9FT ADLT (ELECTROSURGICAL) ×1 IMPLANT
EX-PIN ORTHOLOCK NAV 4X150 (PIN) ×6 IMPLANT
GLOVE BIO SURGEON STRL SZ7.5 (GLOVE) ×6 IMPLANT
GLOVE BIOGEL M STRL SZ7.5 (GLOVE) ×6 IMPLANT
GLOVE BIOGEL PI IND STRL 7.5 (GLOVE) ×1 IMPLANT
GLOVE BIOGEL PI INDICATOR 7.5 (GLOVE) ×2
GLOVE INDICATOR 8.0 STRL GRN (GLOVE) ×3 IMPLANT
GOWN STRL REUS W/ TWL LRG LVL3 (GOWN DISPOSABLE) ×2 IMPLANT
GOWN STRL REUS W/ TWL XL LVL3 (GOWN DISPOSABLE) ×1 IMPLANT
GOWN STRL REUS W/TWL LRG LVL3 (GOWN DISPOSABLE) ×4
GOWN STRL REUS W/TWL XL LVL3 (GOWN DISPOSABLE) ×2
HEMOVAC 400CC 10FR (MISCELLANEOUS) ×3 IMPLANT
HOLDER FOLEY CATH W/STRAP (MISCELLANEOUS) ×3 IMPLANT
HOOD PEEL AWAY FLYTE STAYCOOL (MISCELLANEOUS) ×6 IMPLANT
KIT TURNOVER KIT A (KITS) ×3 IMPLANT
KNIFE SCULPS 14X20 (INSTRUMENTS) ×3 IMPLANT
LABEL OR SOLS (LABEL) ×1 IMPLANT
MANIFOLD NEPTUNE II (INSTRUMENTS) ×3 IMPLANT
NDL SAFETY ECLIPSE 18X1.5 (NEEDLE) ×1 IMPLANT
NDL SPNL 20GX3.5 QUINCKE YW (NEEDLE) ×2 IMPLANT
NEEDLE HYPO 18GX1.5 SHARP (NEEDLE) ×2
NEEDLE SPNL 20GX3.5 QUINCKE YW (NEEDLE) ×6 IMPLANT
NS IRRIG 500ML POUR BTL (IV SOLUTION) ×3 IMPLANT
PACK TOTAL KNEE (MISCELLANEOUS) ×3 IMPLANT
PAD WRAPON POLAR KNEE (MISCELLANEOUS) ×1 IMPLANT
PENCIL SMOKE EVACUATOR COATED (MISCELLANEOUS) ×1 IMPLANT
PENCIL SMOKE ULTRAEVAC 22 CON (MISCELLANEOUS) ×3 IMPLANT
PIN DRILL QUICK PACK ×3 IMPLANT
PIN FIXATION 1/8DIA X 3INL (PIN) ×9 IMPLANT
PULSAVAC PLUS IRRIG FAN TIP (DISPOSABLE) ×3
SOL .9 NS 3000ML IRR  AL (IV SOLUTION) ×2
SOL .9 NS 3000ML IRR UROMATIC (IV SOLUTION) ×1 IMPLANT
SOL PREP PVP 2OZ (MISCELLANEOUS) ×3
SOLUTION PREP PVP 2OZ (MISCELLANEOUS) ×1 IMPLANT
SPONGE DRAIN TRACH 4X4 STRL 2S (GAUZE/BANDAGES/DRESSINGS) ×3 IMPLANT
STAPLER SKIN PROX 35W (STAPLE) ×3 IMPLANT
STOCKINETTE IMPERV 14X48 (MISCELLANEOUS) IMPLANT
STRAP TIBIA SHORT (MISCELLANEOUS) ×3 IMPLANT
SUCTION FRAZIER HANDLE 10FR (MISCELLANEOUS) ×2
SUCTION TUBE FRAZIER 10FR DISP (MISCELLANEOUS) ×1 IMPLANT
SUT VIC AB 0 CT1 36 (SUTURE) ×6 IMPLANT
SUT VIC AB 1 CT1 36 (SUTURE) ×6 IMPLANT
SUT VIC AB 2-0 CT2 27 (SUTURE) ×3 IMPLANT
SYR 20ML LL LF (SYRINGE) ×3 IMPLANT
SYR 30ML LL (SYRINGE) ×6 IMPLANT
TIBIAL BASE ROT PLAT SZ 3 KNEE (Knees) ×3 IMPLANT
TIP FAN IRRIG PULSAVAC PLUS (DISPOSABLE) ×1 IMPLANT
TOWEL OR 17X26 4PK STRL BLUE (TOWEL DISPOSABLE) ×3 IMPLANT
TOWER CARTRIDGE SMART MIX (DISPOSABLE) ×3 IMPLANT
TRAY FOLEY MTR SLVR 16FR STAT (SET/KITS/TRAYS/PACK) ×3 IMPLANT
WRAPON POLAR PAD KNEE (MISCELLANEOUS) ×3

## 2019-09-10 NOTE — Anesthesia Preprocedure Evaluation (Signed)
Anesthesia Evaluation  Patient identified by MRN, date of birth, ID band Patient awake    Reviewed: Allergy & Precautions, H&P , NPO status , Patient's Chart, lab work & pertinent test results, reviewed documented beta blocker date and time   History of Anesthesia Complications (+) PONV and history of anesthetic complications  Airway Mallampati: III   Neck ROM: full    Dental  (+) Poor Dentition, Teeth Intact   Pulmonary neg pulmonary ROS, shortness of breath and with exertion, asthma , sleep apnea ,    Pulmonary exam normal        Cardiovascular Exercise Tolerance: Poor negative cardio ROS Normal cardiovascular exam Rhythm:regular Rate:Normal     Neuro/Psych  Headaches, PSYCHIATRIC DISORDERS Anxiety  Neuromuscular disease negative neurological ROS  negative psych ROS   GI/Hepatic Neg liver ROS, GERD  Medicated,  Endo/Other  negative endocrine ROS  Renal/GU negative Renal ROS  negative genitourinary   Musculoskeletal   Abdominal   Peds  Hematology  (+) Blood dyscrasia, anemia ,   Anesthesia Other Findings Past Medical History: No date: Anemia     Comment:  H/O No date: Anxiety No date: Arthritis No date: Asthma     Comment:  WELL CONTROLLED No date: Complication of anesthesia No date: Dyspnea     Comment:  HAS SEEN DR Gwen Pounds AND HAD STRESS TEST IN MARCH 2017               WITH NORMAL RESULTS No date: GERD (gastroesophageal reflux disease)     Comment:  OCC-TUMS No date: Headache     Comment:  H/O MIGRAINES No date: Neuropathy     Comment:  legs and right side No date: PONV (postoperative nausea and vomiting)     Comment:  distant past No date: PTSD (post-traumatic stress disorder) No date: Sleep apnea     Comment:  USES CPAP Past Surgical History: 1998: BREAST BIOPSY; Left     Comment:  bx/ clip- neg 04/09/2016: CHOLECYSTECTOMY; N/A     Comment:  Procedure: LAPAROSCOPIC CHOLECYSTECTOMY WITH             INTRAOPERATIVE CHOLANGIOGRAM;  Surgeon: Nadeen Landau, MD;  Location: [facility] ORS;  Service: General;                Laterality: N/A; No date: DIAGNOSTIC LAPAROSCOPY No date: DILATION AND CURETTAGE, DIAGNOSTIC / THERAPEUTIC No date: TUBAL LIGATION   Reproductive/Obstetrics negative OB ROS                             Anesthesia Physical Anesthesia Plan  ASA: III  Anesthesia Plan: Spinal   Post-op Pain Management:    Induction:   PONV Risk Score and Plan: 4 or greater  Airway Management Planned:   Additional Equipment:   Intra-op Plan:   Post-operative Plan:   Informed Consent: I have reviewed the patients History and Physical, chart, labs and discussed the procedure including the risks, benefits and alternatives for the proposed anesthesia with the patient or authorized representative who has indicated his/her understanding and acceptance.     Dental Advisory Given  Plan Discussed with: CRNA  Anesthesia Plan Comments:         Anesthesia Quick Evaluation

## 2019-09-10 NOTE — Evaluation (Signed)
Physical Therapy Evaluation Patient Details Name: DEMITRIA HAY MRN: [id number] DOB: 1945-08-14 Today's Date: 09/10/2019   History of Present Illness  Pt is a 74 yo female s/p R TKA, WBAT. PMH of SOB, asthma, sleep apnea, anxiety, GERD.  Clinical Impression  Pt alert, oriented, reported pain 1/10 in R knee. Able to ankle DF/PF, and perform quad sets. The patient reported at baseline she's independent, lives with her husband, 1 fall in the last 6 months (missed her seat, stated that does not happen usually).  The patient was able to perform several supine exercises, without physical assist, light assist for heel slides, SLR without lag. Supine to sit with supervision and use of bed rails, exhibited good sitting balance. Sit <> stand with RW and CGA, instructed in hand placement for safety and weight bearing precautions. The patient was able to take several small steps towards recliner with RW and CGA, some difficulty noted with foot placement. Pt did endorse that she could feel the floor under her feet, but it wasn't back to normal just yet, further mobility deferred.  Overall the patient demonstrated deficits (see "PT Problem List") that impede the patient's functional abilities, safety, and mobility and would benefit from skilled PT intervention. Recommendation is HHPT.     Follow Up Recommendations Home health PT    Equipment Recommendations  Rolling walker with 5" wheels;3in1 (PT)    Recommendations for Other Services       Precautions / Restrictions Precautions Precautions: Fall;Knee Precaution Booklet Issued: Yes (comment) Restrictions Weight Bearing Restrictions: Yes RLE Weight Bearing: Weight bearing as tolerated      Mobility  Bed Mobility Overal bed mobility: Needs Assistance Bed Mobility: Supine to Sit     Supine to sit: Supervision;HOB elevated        Transfers Overall transfer level: Needs assistance Equipment used: Rolling walker (2 wheeled) Transfers: Sit  to/from Stand Sit to Stand: Min guard            Ambulation/Gait Ambulation/Gait assistance: Min guard Gait Distance (Feet): 2 Feet Assistive device: Rolling walker (2 wheeled)       General Gait Details: Pt able to take several steps to recliner in the room. Did endorse once in standing that she could feel her feet on the floor, but wasn't quite back to normal. Small difficulty noted with foot placement.  Stairs            Wheelchair Mobility    Modified Rankin (Stroke Patients Only)       Balance Overall balance assessment: Needs assistance Sitting-balance support: Feet supported Sitting balance-Leahy Scale: Good       Standing balance-Leahy Scale: Fair Standing balance comment: reliant on UE support                             Pertinent Vitals/Pain Pain Assessment: 0-10 Pain Score: 1  Pain Location: R knee, quad Pain Descriptors / Indicators: Aching Pain Intervention(s): Limited activity within patient's tolerance;Repositioned;Ice applied    Home Living Family/patient expects to be discharged to:: Private residence Living Arrangements: Spouse/significant other Available Help at Discharge: Family;Available 24 hours/day Type of Home: House Home Access: Level entry     Home Layout: One level Home Equipment: Walker - 4 wheels      Prior Function Level of Independence: Independent               Hand Dominance   Dominant Hand: Right    Extremity/Trunk Assessment  Upper Extremity Assessment Upper Extremity Assessment: Overall WFL for tasks assessed    Lower Extremity Assessment Lower Extremity Assessment: RLE deficits/detail;LLE deficits/detail RLE Deficits / Details: able to perform SLR LLE Deficits / Details: WFLs    Cervical / Trunk Assessment Cervical / Trunk Assessment: Normal  Communication   Communication: No difficulties  Cognition Arousal/Alertness: Awake/alert Behavior During Therapy: WFL for tasks  assessed/performed Overall Cognitive Status: Within Functional Limits for tasks assessed                                        General Comments      Exercises Total Joint Exercises Ankle Circles/Pumps: AROM;Both;15 reps Quad Sets: AROM;Strengthening;Right;10 reps Heel Slides: AAROM;Strengthening;Right;10 reps Hip ABduction/ADduction: AROM;Strengthening;Right;10 reps Straight Leg Raises: AROM;Strengthening;Right;10 reps Goniometric ROM: 0 - 62 degrees   Assessment/Plan    PT Assessment Patient needs continued PT services  PT Problem List Decreased strength;Decreased mobility;Decreased range of motion;Decreased knowledge of precautions;Decreased activity tolerance;Decreased balance;Pain;Decreased knowledge of use of DME       PT Treatment Interventions DME instruction;Therapeutic exercise;Gait training;Balance training;Stair training;Neuromuscular re-education;Functional mobility training;Therapeutic activities;Patient/family education    PT Goals (Current goals can be found in the Care Plan section)  Acute Rehab PT Goals Patient Stated Goal: to go home PT Goal Formulation: With patient Time For Goal Achievement: 09/24/19 Potential to Achieve Goals: Good    Frequency BID   Barriers to discharge        Co-evaluation               AM-PAC PT "6 Clicks" Mobility  Outcome Measure Help needed turning from your back to your side while in a flat bed without using bedrails?: A Little Help needed moving from lying on your back to sitting on the side of a flat bed without using bedrails?: A Little Help needed moving to and from a bed to a chair (including a wheelchair)?: A Little Help needed standing up from a chair using your arms (e.g., wheelchair or bedside chair)?: A Little Help needed to walk in hospital room?: A Little Help needed climbing 3-5 steps with a railing? : A Little 6 Click Score: 18    End of Session Equipment Utilized During Treatment:  Gait belt Activity Tolerance: Patient tolerated treatment well Patient left: in chair;with call bell/phone within reach;with SCD's reapplied(heels elevated, polar care in place) Nurse Communication: Mobility status PT Visit Diagnosis: Other abnormalities of gait and mobility (R26.89);Muscle weakness (generalized) (M62.81);Pain;Difficulty in walking, not elsewhere classified (R26.2) Pain - Right/Left: Right Pain - part of body: Knee    Time: 2979-8921 PT Time Calculation (min) (ACUTE ONLY): 34 min   Charges:   PT Evaluation $PT Eval Low Complexity: 1 Low PT Treatments $Therapeutic Exercise: 23-37 mins        Olga Coaster PT, DPT 3:23 PM,09/10/19

## 2019-09-10 NOTE — Progress Notes (Signed)
ORTHOPEDIC POSTOP CHECK: The patient is up and in the chair.  She denies any significant pain at this time.  She denies any nausea or vomiting. The patient tolerated physical therapy and Occupational Therapy well this afternoon.  Notes were reviewed.  Polar Care is in place and functioning.  Dressing is dry and intact to the right knee.  The patient will continue with physical therapy and Occupational Therapy tomorrow as per total knee arthroplasty rehab protocol.  Bianca Raneri P. Angie Fava M.D.

## 2019-09-10 NOTE — Evaluation (Signed)
Occupational Therapy Evaluation Patient Details Name: Jacqueline Miller MRN: [id number] DOB: [birth year] Today's Date: 09/10/2019    History of Present Illness Pt is a 74 yo female s/p R TKA, WBAT. PMH of SOB, asthma, sleep apnea, anxiety, GERD.   Clinical Impression   Pt seen for OT evaluation this date, POD#0 from above surgery. Pt was independent in all ADL prior to surgery and reports having difficulty with negotiating steps at home and has been using back level entry. Pt is eager to return to PLOF with improved safety and independence. Pt currently requires minimal assist for LB dressing and bathing while in seated position due to pain and limited AROM of R knee. Pt instructed in polar care mgt, falls prevention strategies, home/routines modifications, DME/AE for LB bathing and dressing tasks, pet care considerations, and compression stocking mgt. Handout provided to support recall and carryover. Pt verbalized understanding. Pt would benefit from skilled OT services including additional instruction in dressing techniques with or without assistive devices for dressing and bathing skills prior to discharge to maximize safety, independence, and minimize falls risk and caregiver burden. Do not currently anticipate any OT needs following this hospitalization.      Follow Up Recommendations  No OT follow up    Equipment Recommendations  3 in 1 bedside commode    Recommendations for Other Services       Precautions / Restrictions Precautions Precautions: Fall;Knee Precaution Booklet Issued: Yes (comment) Restrictions Weight Bearing Restrictions: Yes RLE Weight Bearing: Weight bearing as tolerated      Mobility Bed Mobility Overal bed mobility: Needs Assistance Bed Mobility: Supine to Sit     Supine to sit: Supervision;HOB elevated     General bed mobility comments: deferred, on BSC at start of session, in recliner at end of session  Transfers Overall transfer level: Needs  assistance Equipment used: Rolling walker (2 wheeled) Transfers: Sit to/from Stand Sit to Stand: Min guard              Balance Overall balance assessment: Needs assistance Sitting-balance support: Feet supported Sitting balance-Leahy Scale: Good     Standing balance support: Bilateral upper extremity supported Standing balance-Leahy Scale: Fair Standing balance comment: reliant on UE support                           ADL either performed or assessed with clinical judgement   ADL Overall ADL's : Needs assistance/impaired                                       General ADL Comments: Min A for LB ADL to initiate threading of clothing over R foot or bathing 2/2 decreased strength/ROM/pain in R knee, mod assist for polar care and compression stocking mgt - pt reports spouse able to provide needed level of care at home     Vision Baseline Vision/History: Wears glasses Patient Visual Report: No change from baseline       Perception     Praxis      Pertinent Vitals/Pain Pain Assessment: 0-10 Pain Score: 2  Pain Location: R knee, quad Pain Descriptors / Indicators: Aching Pain Intervention(s): Limited activity within patient's tolerance;Monitored during session;Repositioned;Ice applied     Hand Dominance Right   Extremity/Trunk Assessment Upper Extremity Assessment Upper Extremity Assessment: Overall WFL for tasks assessed   Lower Extremity Assessment Lower Extremity Assessment: Defer to  PT evaluation;RLE deficits/detail;LLE deficits/detail RLE Deficits / Details: able to perform SLR LLE Deficits / Details: WFLs   Cervical / Trunk Assessment Cervical / Trunk Assessment: Normal   Communication Communication Communication: No difficulties   Cognition Arousal/Alertness: Awake/alert Behavior During Therapy: WFL for tasks assessed/performed Overall Cognitive Status: Within Functional Limits for tasks assessed                                      General Comments       Exercises  Other Exercises Other Exercises: Pt instructed in polar care mgt, compression stocking mgt, AE/DME, home/routines modifications, falls prevention, and pet care considerations; handout provided to support recall and carryover   Shoulder Instructions      Home Living Family/patient expects to be discharged to:: Private residence Living Arrangements: Spouse/significant other Available Help at Discharge: Family;Available 24 hours/day Type of Home: House Home Access: Level entry;Stairs to enter Entrance Stairs-Number of Steps: level entry from back (main entrance for her), front porch as 5 steps with bilat rails Entrance Stairs-Rails: Can reach both;Left;Right Home Layout: One level     Bathroom Shower/Tub: Tub/shower unit;Walk-in shower   Bathroom Toilet: Standard     Home Equipment: Environmental consultant - 4 wheels;Adaptive equipment Adaptive Equipment: Reacher        Prior Functioning/Environment Level of Independence: Independent                 OT Problem List: Decreased strength;Pain;Decreased range of motion      OT Treatment/Interventions: Self-care/ADL training;Therapeutic exercise;Therapeutic activities;DME and/or AE instruction;Patient/family education    OT Goals(Current goals can be found in the care plan section) Acute Rehab OT Goals Patient Stated Goal: to go home OT Goal Formulation: With patient Time For Goal Achievement: 09/24/19 Potential to Achieve Goals: Good ADL Goals Pt Will Perform Lower Body Dressing: with modified independence;sit to/from stand Pt Will Transfer to Toilet: with modified independence;ambulating(elevated toilet or BSC over toilet, LRAD for amb) Additional ADL Goal #1: Pt will independently instruct family/caregiver in polar care mgt Additional ADL Goal #2: Pt will independently instruct family/caregiver in compression stocking mgt  OT Frequency: Min 1X/week   Barriers to D/C:             Co-evaluation              AM-PAC OT "6 Clicks" Daily Activity     Outcome Measure Help from another person eating meals?: None Help from another person taking care of personal grooming?: None Help from another person toileting, which includes using toliet, bedpan, or urinal?: A Little Help from another person bathing (including washing, rinsing, drying)?: A Little Help from another person to put on and taking off regular upper body clothing?: None Help from another person to put on and taking off regular lower body clothing?: A Little 6 Click Score: 21   End of Session Equipment Utilized During Treatment: Gait belt;Rolling walker Nurse Communication: Mobility status  Activity Tolerance: Patient tolerated treatment well Patient left: in chair;with call bell/phone within reach;with SCD's reapplied;Other (comment)(polar care in place)  OT Visit Diagnosis: Other abnormalities of gait and mobility (R26.89);Pain Pain - Right/Left: Right Pain - part of body: Knee                Time: 3419-6222 OT Time Calculation (min): 34 min Charges:  OT General Charges $OT Visit: 1 Visit OT Evaluation $OT Eval Low Complexity: 1 Low OT Treatments $  Self Care/Home Management : 23-37 mins  Richrd Prime, MPH, MS, OTR/L ascom [phone] 09/10/19, 4:54 PM

## 2019-09-10 NOTE — Anesthesia Procedure Notes (Addendum)
Spinal  Patient location during procedure: OR Start time: 09/10/2019 8:32 AM End time: 09/10/2019 8:38 AM Staffing Performed: resident/CRNA  Anesthesiologist: Yevette Edwards, MD Resident/CRNA: Elmarie Mainland, CRNA Preanesthetic Checklist Completed: patient identified, IV checked, site marked, risks and benefits discussed, surgical consent, monitors and equipment checked, pre-op evaluation and timeout performed Spinal Block Patient position: sitting Prep: ChloraPrep Patient monitoring: continuous pulse ox and blood pressure Approach: midline Location: L3-4 Injection technique: single-shot Needle Needle type: Pencil-Tip  Needle gauge: 25 G Assessment Sensory level: T10 Additional Notes Negative paresthesia, Negative heme

## 2019-09-10 NOTE — H&P (Signed)
The patient has been re-examined, and the chart reviewed, and there have been no interval changes to the documented history and physical.    The risks, benefits, and alternatives have been discussed at length. The patient expressed understanding of the risks benefits and agreed with plans for surgical intervention.  Zeina Akkerman P. Osie Merkin, Jr. M.D.    

## 2019-09-10 NOTE — Op Note (Signed)
OPERATIVE NOTE  DATE OF SURGERY:  09/10/2019  PATIENT NAME:  Jacqueline Miller   DOB: 05-24-1946  MRN: [id number]  PRE-OPERATIVE DIAGNOSIS: Degenerative arthrosis of the right knee, primary  POST-OPERATIVE DIAGNOSIS:  Same  PROCEDURE:  Right total knee arthroplasty using computer-assisted navigation  SURGEON:  Marciano Sequin. M.D.  ASSISTANT: Cassell Smiles, PA-C (present and scrubbed throughout the case, critical for assistance with exposure, retraction, instrumentation, and closure)  ANESTHESIA: spinal  ESTIMATED BLOOD LOSS: 50 mL  FLUIDS REPLACED: 1000 mL of crystalloid  TOURNIQUET TIME: 92 minutes  DRAINS: 2 medium Hemovac drains  SOFT TISSUE RELEASES: Anterior cruciate ligament, posterior cruciate ligament, deep medial collateral ligament, patellofemoral ligament  IMPLANTS UTILIZED: DePuy Attune size 4N posterior stabilized femoral component (cemented), size 3 rotating platform tibial component (cemented), 32 mm medialized dome patella (cemented), and a 5 mm stabilized rotating platform polyethylene insert.  INDICATIONS FOR SURGERY: Jacqueline Miller is a 74 y.o. year old female with a long history of progressive knee pain. X-rays demonstrated severe degenerative changes in tricompartmental fashion. The patient had not seen any significant improvement despite conservative nonsurgical intervention. After discussion of the risks and benefits of surgical intervention, the patient expressed understanding of the risks benefits and agree with plans for total knee arthroplasty.   The risks, benefits, and alternatives were discussed at length including but not limited to the risks of infection, bleeding, nerve injury, stiffness, blood clots, the need for revision surgery, cardiopulmonary complications, among others, and they were willing to proceed.  PROCEDURE IN DETAIL: The patient was brought into the operating room and, after adequate spinal anesthesia was achieved, a tourniquet was placed  on the patient's upper thigh. The patient's knee and leg were cleaned and prepped with alcohol and DuraPrep and draped in the usual sterile fashion. A "timeout" was performed as per usual protocol. The lower extremity was exsanguinated using an Esmarch, and the tourniquet was inflated to 300 mmHg. An anterior longitudinal incision was made followed by a standard mid vastus approach. The deep fibers of the medial collateral ligament were elevated in a subperiosteal fashion off of the medial flare of the tibia so as to maintain a continuous soft tissue sleeve. The patella was subluxed laterally and the patellofemoral ligament was incised. Inspection of the knee demonstrated severe degenerative changes with full-thickness loss of articular cartilage. Osteophytes were debrided using a rongeur. Anterior and posterior cruciate ligaments were excised. Two 4.0 mm Schanz pins were inserted in the femur and into the tibia for attachment of the array of trackers used for computer-assisted navigation. Hip center was identified using a circumduction technique. Distal landmarks were mapped using the computer. The distal femur and proximal tibia were mapped using the computer. The distal femoral cutting guide was positioned using computer-assisted navigation so as to achieve a 5 distal valgus cut. The femur was sized and it was felt that a size 4N femoral component was appropriate. A size 4 femoral cutting guide was positioned and the anterior cut was performed and verified using the computer. This was followed by completion of the posterior and chamfer cuts. Femoral cutting guide for the central box was then positioned in the center box cut was performed.  Attention was then directed to the proximal tibia. Medial and lateral menisci were excised. The extramedullary tibial cutting guide was positioned using computer-assisted navigation so as to achieve a 0 varus-valgus alignment and 3 posterior slope. The cut was performed  and verified using the computer. The proximal tibia was  sized and it was felt that a size 3 tibial tray was appropriate. Tibial and femoral trials were inserted followed by insertion of a 5 mm polyethylene insert. This allowed for excellent mediolateral soft tissue balancing both in flexion and in full extension. Finally, the patella was cut and prepared so as to accommodate a 32 mm medialized dome patella. A patella trial was placed and the knee was placed through a range of motion with excellent patellar tracking appreciated. The femoral trial was removed after debridement of posterior osteophytes. The central post-hole for the tibial component was reamed followed by insertion of a keel punch. Tibial trials were then removed. Cut surfaces of bone were irrigated with copious amounts of normal saline with antibiotic solution using pulsatile lavage and then suctioned dry. Polymethylmethacrylate cement was prepared in the usual fashion using a vacuum mixer. Cement was applied to the cut surface of the proximal tibia as well as along the undersurface of a size 3 rotating platform tibial component. Tibial component was positioned and impacted into place. Excess cement was removed using Personal assistant. Cement was then applied to the cut surfaces of the femur as well as along the posterior flanges of the size 4N femoral component. The femoral component was positioned and impacted into place. Excess cement was removed using Personal assistant. A 5 mm polyethylene trial was inserted and the knee was brought into full extension with steady axial compression applied. Finally, cement was applied to the backside of a 32 mm medialized dome patella and the patellar component was positioned and patellar clamp applied. Excess cement was removed using Personal assistant. After adequate curing of the cement, the tourniquet was deflated after a total tourniquet time of 92 minutes. Hemostasis was achieved using electrocautery. The knee was  irrigated with copious amounts of normal saline with antibiotic solution using pulsatile lavage and then suctioned dry. 20 mL of 1.3% Exparel and 60 mL of 0.25% Marcaine in 40 mL of normal saline was injected along the posterior capsule, medial and lateral gutters, and along the arthrotomy site. A 5 mm stabilized rotating platform polyethylene insert was inserted and the knee was placed through a range of motion with excellent mediolateral soft tissue balancing appreciated and excellent patellar tracking noted. 2 medium drains were placed in the wound bed and brought out through separate stab incisions. The medial parapatellar portion of the incision was reapproximated using interrupted sutures of #1 Vicryl. Subcutaneous tissue was approximated in layers using first #0 Vicryl followed #2-0 Vicryl. The skin was approximated with skin staples. A sterile dressing was applied.  The patient tolerated the procedure well and was transported to the recovery room in stable condition.    Kaylise Blakeley P. Angie Fava., M.D.

## 2019-09-10 NOTE — Transfer of Care (Signed)
Immediate Anesthesia Transfer of Care Note  Patient: Jacqueline Miller  Procedure(s) Performed: COMPUTER ASSISTED TOTAL KNEE ARTHROPLASTY (Right Knee)  Patient Location: PACU  Anesthesia Type:Spinal  Level of Consciousness: awake, alert  and oriented  Airway & Oxygen Therapy: Patient Spontanous Breathing  Post-op Assessment: Report given to RN and Post -op Vital signs reviewed and stable  Post vital signs: Reviewed and stable  Last Vitals:  Vitals Value Taken Time  BP    Temp    Pulse    Resp    SpO2      Last Pain:  Vitals:   09/10/19 0804  TempSrc: Tympanic  PainSc: 2          Complications: No apparent anesthesia complications

## 2019-09-11 NOTE — Progress Notes (Signed)
Physical Therapy Treatment Patient Details Name: Jacqueline Miller MRN: [id number] DOB: 1945-09-25 Today's Date: 09/11/2019    History of Present Illness Pt is a 74 yo female s/p R TKA, WBAT. PMH of SOB, asthma, sleep apnea, anxiety, GERD.    PT Comments    Pt was long sitting in bed upon arriving. She reports no pain and is highly motivated. She demonstrated safe ability to exit R side of bed with supervision, stood and ambulated 1 lap in hallway with RW with supervision. Pt is progressing well. Therapist will return later this date to progress ROM and strength as per POC. Pt will benefit from skilled HHPT at DC to address strength, ROM, and safe functional mobility deficits.     Follow Up Recommendations  Home health PT     Equipment Recommendations  Rolling walker with 5" wheels;3in1 (PT)    Recommendations for Other Services       Precautions / Restrictions Precautions Precautions: Fall;Knee Precaution Booklet Issued: Yes (comment) Restrictions Weight Bearing Restrictions: Yes RLE Weight Bearing: Weight bearing as tolerated    Mobility  Bed Mobility Overal bed mobility: Needs Assistance Bed Mobility: Supine to Sit     Supine to sit: Supervision;HOB elevated     General bed mobility comments: Pt was able to exit R side of bed with increased time however required no physical assistance.  Transfers Overall transfer level: Needs assistance Equipment used: Rolling walker (2 wheeled) Transfers: Sit to/from Stand Sit to Stand: Supervision         General transfer comment: Pt demonstrated safe ability to stand and sit from recliner and EOB without assistance.   Ambulation/Gait Ambulation/Gait assistance: Supervision Gait Distance (Feet): 160 Feet Assistive device: Rolling walker (2 wheeled) Gait Pattern/deviations: Step-through pattern;Antalgic Gait velocity: decreased   General Gait Details: Pt was able to safely ambulate one lap in hllway without unsteadiness  or LOB.    Stairs             Wheelchair Mobility    Modified Rankin (Stroke Patients Only)       Balance Overall balance assessment: Needs assistance Sitting-balance support: Feet supported Sitting balance-Leahy Scale: Normal     Standing balance support: Bilateral upper extremity supported Standing balance-Leahy Scale: Good Standing balance comment: several times was able to let go of RW in static standing without LOB                            Cognition Arousal/Alertness: Awake/alert Behavior During Therapy: WFL for tasks assessed/performed Overall Cognitive Status: Within Functional Limits for tasks assessed                                 General Comments: pt is A and O x 4 and cooperative and pleasant      Exercises      General Comments        Pertinent Vitals/Pain Pain Assessment: No/denies pain Pain Score: 0-No pain Pain Location: R knee, quad Pain Descriptors / Indicators: Aching Pain Intervention(s): Limited activity within patient's tolerance;Monitored during session;Ice applied    Home Living                      Prior Function            PT Goals (current goals can now be found in the care plan section) Acute Rehab PT Goals Patient  Stated Goal: to go home Progress towards PT goals: Progressing toward goals    Frequency    BID      PT Plan Current plan remains appropriate    Co-evaluation              AM-PAC PT "6 Clicks" Mobility   Outcome Measure  Help needed turning from your back to your side while in a flat bed without using bedrails?: None Help needed moving from lying on your back to sitting on the side of a flat bed without using bedrails?: None Help needed moving to and from a bed to a chair (including a wheelchair)?: A Little Help needed standing up from a chair using your arms (e.g., wheelchair or bedside chair)?: A Little Help needed to walk in hospital room?: A  Little Help needed climbing 3-5 steps with a railing? : A Little 6 Click Score: 20    End of Session Equipment Utilized During Treatment: Gait belt Activity Tolerance: Patient tolerated treatment well Patient left: in chair;with call bell/phone within reach;with SCD's reapplied Nurse Communication: Mobility status PT Visit Diagnosis: Other abnormalities of gait and mobility (R26.89);Muscle weakness (generalized) (M62.81);Pain;Difficulty in walking, not elsewhere classified (R26.2) Pain - Right/Left: Right Pain - part of body: Knee     Time: 6237-6283 PT Time Calculation (min) (ACUTE ONLY): 17 min  Charges:  $Gait Training: 8-22 mins                     Julaine Fusi PTA 09/11/19, 9:59 AM

## 2019-09-11 NOTE — Anesthesia Postprocedure Evaluation (Signed)
Anesthesia Post Note  Patient: Jacqueline Miller  Procedure(s) Performed: COMPUTER ASSISTED TOTAL KNEE ARTHROPLASTY (Right Knee)  Patient location during evaluation: Nursing Unit Anesthesia Type: Spinal Level of consciousness: awake, oriented and awake and alert Pain management: pain level controlled Vital Signs Assessment: post-procedure vital signs reviewed and stable Respiratory status: spontaneous breathing, respiratory function stable and nonlabored ventilation Cardiovascular status: stable Postop Assessment: no headache, no backache, no apparent nausea or vomiting, patient able to bend at knees, able to ambulate and adequate PO intake Anesthetic complications: no     Last Vitals:  Vitals:   09/11/19 0019 09/11/19 0750  BP: (!) 116/59 134/65  Pulse: 69 66  Resp:    Temp: 36.6 C 36.7 C  SpO2: 96% 95%    Last Pain:  Vitals:   09/11/19 0810  TempSrc:   PainSc: 3                  Kindel Rochefort,  Maleiyah Releford R

## 2019-09-11 NOTE — Progress Notes (Signed)
Occupational Therapy Treatment Patient Details Name: Jacqueline Miller MRN: [id number] DOB: Feb 17, 1946 Today's Date: 09/11/2019    History of present illness Pt is a 74 yo female s/p R TKA, WBAT. PMH of SOB, asthma, sleep apnea, anxiety, GERD.   OT comments  Pt seen for OT tx this date. Pt pleasant and reports minimal pain (2/10) with movement in R knee. Pt instructed in car transfers and RW mgt in kitchen and bathroom for meal prep and ADL tasks at sink; review of learned strategies and education from previous session to support recall and carryover with cues needed to help recall some info. Pt progressing well towards goals. Will continue to progress while hospitalized.    Follow Up Recommendations  No OT follow up    Equipment Recommendations  3 in 1 bedside commode    Recommendations for Other Services      Precautions / Restrictions Precautions Precautions: Fall;Knee Precaution Booklet Issued: Yes (comment) Restrictions Weight Bearing Restrictions: Yes RLE Weight Bearing: Weight bearing as tolerated       Mobility Bed Mobility Overal bed mobility: Needs Assistance Bed Mobility: Supine to Sit     Supine to sit: Supervision;HOB elevated     General bed mobility comments: deferred, up in recliner  Transfers Overall transfer level: Needs assistance Equipment used: Rolling walker (2 wheeled) Transfers: Sit to/from Stand Sit to Stand: Supervision         General transfer comment: Pt demonstrated safe ability to stand and sit from recliner and EOB without assistance.     Balance Overall balance assessment: Needs assistance Sitting-balance support: Feet supported Sitting balance-Leahy Scale: Normal     Standing balance support: Bilateral upper extremity supported Standing balance-Leahy Scale: Good Standing balance comment: several times was able to let go of RW in static standing without LOB                           ADL either performed or assessed  with clinical judgement   ADL Overall ADL's : Needs assistance/impaired                                       General ADL Comments: Min A for LB ADL to initiate threading of clothing over R foot or bathing 2/2 decreased strength/ROM/pain in R knee, mod assist for polar care and compression stocking mgt - pt reports spouse able to provide needed level of care at home     Vision Baseline Vision/History: Wears glasses Patient Visual Report: No change from baseline     Perception     Praxis      Cognition Arousal/Alertness: Awake/alert Behavior During Therapy: WFL for tasks assessed/performed Overall Cognitive Status: Within Functional Limits for tasks assessed                                 General Comments: pt is A and O x 4 and cooperative and pleasant        Exercises Other Exercises Other Exercises: Pt instructed in car transfers and RW mgt in kitchen and bathroom for meal prep and ADL tasks at sink; review of instruction from previous session to support recall and carryover with cues needed to help recall some info   Shoulder Instructions       General Comments  Pertinent Vitals/ Pain       Pain Assessment: 0-10 Pain Score: 2  Pain Location: R knee Pain Descriptors / Indicators: Aching Pain Intervention(s): Limited activity within patient's tolerance;Monitored during session;Repositioned;Ice applied  Home Living                                          Prior Functioning/Environment              Frequency  Min 1X/week        Progress Toward Goals  OT Goals(current goals can now be found in the care plan section)  Progress towards OT goals: Progressing toward goals  Acute Rehab OT Goals Patient Stated Goal: to go home OT Goal Formulation: With patient Time For Goal Achievement: 09/24/19 Potential to Achieve Goals: Good  Plan Discharge plan remains appropriate;Frequency remains appropriate     Co-evaluation                 AM-PAC OT "6 Clicks" Daily Activity     Outcome Measure   Help from another person eating meals?: None Help from another person taking care of personal grooming?: None Help from another person toileting, which includes using toliet, bedpan, or urinal?: A Little Help from another person bathing (including washing, rinsing, drying)?: A Little Help from another person to put on and taking off regular upper body clothing?: None Help from another person to put on and taking off regular lower body clothing?: A Little 6 Click Score: 21    End of Session    OT Visit Diagnosis: Other abnormalities of gait and mobility (R26.89);Pain Pain - Right/Left: Right Pain - part of body: Knee   Activity Tolerance Patient tolerated treatment well   Patient Left in chair;with call bell/phone within reach;Other (comment)(polar care in place)   Nurse Communication          Time: [phone] OT Time Calculation (min): 15 min  Charges: OT General Charges $OT Visit: 1 Visit OT Treatments $Self Care/Home Management : 8-22 mins  Richrd Prime, MPH, MS, OTR/L ascom [phone] 09/11/19, 10:35 AM

## 2019-09-11 NOTE — Progress Notes (Signed)
Physical Therapy Treatment Patient Details Name: Jacqueline Miller MRN: [id number] DOB: [birth year] Today's Date: 09/11/2019    History of Present Illness Pt is a 74 yo female s/p R TKA, WBAT. PMH of SOB, asthma, sleep apnea, anxiety, GERD.    PT Comments    Pt was seated in recliner upon arriving. She continues to reports feeling well and is agreeable to PT session. She stood and ambulated with RW + supervision. Ambulated 120 ft this afternoon and 160 ft this morning. No LOB or unsteadiness. When she returned to room, she performed several stretching exercises in sitting prior to performing HEP handout. See exercises listed below. AROM R LE, after stretching, 2-77 degrees. Overall progressing well with all PT goals. She will benefit from HHPT at DC to continue to progress ROM, strength, and overall safe functional mobility. Pt was repositioned in bed, bed alarm placed,call bell in reach,polar care running, and pt's R LE (heel) resting in blue foam for extension. Acute PT will continue to follow per POC.    Follow Up Recommendations  Home health PT     Equipment Recommendations  Rolling walker with 5" wheels;3in1 (PT)    Recommendations for Other Services       Precautions / Restrictions Precautions Precautions: Fall;Knee Precaution Booklet Issued: Yes (comment) Restrictions Weight Bearing Restrictions: Yes RLE Weight Bearing: Weight bearing as tolerated    Mobility  Bed Mobility Overal bed mobility: Modified Independent Bed Mobility: Sit to Supine       Sit to supine: Modified independent (Device/Increase time);HOB elevated   General bed mobility comments: Pt was able to get back into bed after ambulation without assistance. HOB was elevated however pt demonstrates ability to perform I'ly  Transfers Overall transfer level: Needs assistance Equipment used: Rolling walker (2 wheeled) Transfers: Sit to/from Stand Sit to Stand: Supervision             Ambulation/Gait Ambulation/Gait assistance: Supervision Gait Distance (Feet): 120 Feet Assistive device: Rolling walker (2 wheeled) Gait Pattern/deviations: Step-through pattern;Antalgic Gait velocity: decreased   General Gait Details: ambulated less distance this afternoon 2/2 to slight increase in pain. Overall demonstrates safe steady gait kinematics.   Stairs             Wheelchair Mobility    Modified Rankin (Stroke Patients Only)       Balance Overall balance assessment: Modified Independent Sitting-balance support: Feet supported Sitting balance-Leahy Scale: Normal     Standing balance support: Bilateral upper extremity supported Standing balance-Leahy Scale: Good                              Cognition Arousal/Alertness: Awake/alert Behavior During Therapy: WFL for tasks assessed/performed Overall Cognitive Status: Within Functional Limits for tasks assessed                                 General Comments: pt is A and O x 4 and cooperative and pleasant      Exercises Total Joint Exercises Ankle Circles/Pumps: AROM;20 reps;Supine Quad Sets: AROM;10 reps;Supine Gluteal Sets: AROM;10 reps;Supine Short Arc Quad: AAROM;5 reps Heel Slides: AROM;10 reps;Supine Hip ABduction/ADduction: AROM;10 reps Straight Leg Raises: 10 reps;AROM Knee Flexion: AROM;5 reps;Seated Goniometric ROM: 2-77 Other Exercises Other Exercises: Pt instructed in car transfers and RW mgt in kitchen and bathroom for meal prep and ADL tasks at sink; review of instruction from previous session to  support recall and carryover with cues needed to help recall some info    General Comments        Pertinent Vitals/Pain Pain Assessment: No/denies pain Pain Score: 2  Pain Location: R knee Pain Descriptors / Indicators: Aching Pain Intervention(s): Limited activity within patient's tolerance;Monitored during session;Repositioned;Ice applied    Home Living                       Prior Function            PT Goals (current goals can now be found in the care plan section) Acute Rehab PT Goals Patient Stated Goal: to go home Progress towards PT goals: Progressing toward goals    Frequency    BID      PT Plan Current plan remains appropriate    Co-evaluation              AM-PAC PT "6 Clicks" Mobility   Outcome Measure  Help needed turning from your back to your side while in a flat bed without using bedrails?: None Help needed moving from lying on your back to sitting on the side of a flat bed without using bedrails?: None Help needed moving to and from a bed to a chair (including a wheelchair)?: A Little Help needed standing up from a chair using your arms (e.g., wheelchair or bedside chair)?: A Little Help needed to walk in hospital room?: A Little Help needed climbing 3-5 steps with a railing? : A Little 6 Click Score: 20    End of Session Equipment Utilized During Treatment: Gait belt Activity Tolerance: Patient tolerated treatment well Patient left: in bed;with call bell/phone within reach;with bed alarm set Nurse Communication: Mobility status PT Visit Diagnosis: Other abnormalities of gait and mobility (R26.89);Muscle weakness (generalized) (M62.81);Pain;Difficulty in walking, not elsewhere classified (R26.2) Pain - Right/Left: Right Pain - part of body: Knee     Time: 9211-9417 PT Time Calculation (min) (ACUTE ONLY): 21 min  Charges:  $Therapeutic Exercise: 8-22 mins                     Jacqueline Miller PTA 09/11/19, 1:41 PM

## 2019-09-11 NOTE — Progress Notes (Signed)
ORTHOPAEDICS PROGRESS NOTE  PATIENT NAME: Jacqueline Miller DOB: [birth year]  MRN: [id number]  POD # 1: Right total knee arthroplasty  Subjective: The patient states that pain is been under good control.  She denies any nausea or vomiting.  Objective: Vital signs in last 24 hours: Temp:  [97.8 F (36.6 C)-98.6 F (37 C)] 97.8 F (36.6 C) (04/20 0019) Pulse Rate:  [68-86] 69 (04/20 0019) Resp:  [15-20] 16 (04/19 2020) BP: (104-146)/(49-82) 116/59 (04/20 0019) SpO2:  [92 %-99 %] 96 % (04/20 0019) Weight:  [83 kg] 83 kg (04/19 1419)  Intake/Output from previous day: 04/19 0701 - 04/20 0700 In: 3209.2 [I.V.:2409.2; IV Piggyback:800] Out: 1705 [Urine:1450; Drains:205; Blood:50]  No results for input(s): WBC, HGB, HCT, PLT, K, CL, CO2, BUN, CREATININE, GLUCOSE, CALCIUM, LABPT, INR in the last 72 hours.  EXAM General: Well-developed well-nourished female seen in no apparent discomfort. Lungs: clear to auscultation Cardiac: normal rate and regular rhythm Abdomen: Soft, nontender, nondistended.  Bowel sounds are present. Right lower extremity: Dressing is dry and intact.  Polar Care and Hemovac are in place and functioning.  The patient is able to perform an independent straight leg raise.  Homans test is negative. Neurologic: Awake, alert, and oriented.  Sensory and motor function are intact.  Assessment: Right total knee arthroplasty  Secondary diagnoses: Sleep apnea Neuropathy Gastroesophageal reflux disease Asthma Anxiety Posttraumatic stress disorder  Plan: Today's goals were reviewed with the patient.  Continue with physical therapy and Occupational Therapy as per total knee arthroplasty rehab protocol. Plan is to go Home after hospital stay. DVT Prophylaxis - Lovenox, Foot Pumps and TED hose  Christelle Igoe P. Angie Fava M.D.

## 2019-09-12 MED ORDER — CELECOXIB 200 MG PO CAPS: 200.0000 mg | ORAL_CAPSULE | Freq: Two times a day (BID) | ORAL | 0 refills | Status: DC

## 2019-09-12 MED ORDER — ENOXAPARIN SODIUM 40 MG/0.4ML ~~LOC~~ SOLN: 40.0000 mg | SUBCUTANEOUS | 0 refills | Status: DC

## 2019-09-12 MED ORDER — TRAMADOL HCL 50 MG PO TABS: 50.0000 mg | ORAL_TABLET | ORAL | 0 refills | Status: DC | PRN

## 2019-09-12 MED ORDER — OXYCODONE HCL 5 MG PO TABS: 5.0000 mg | ORAL_TABLET | ORAL | 0 refills | Status: DC | PRN

## 2019-09-12 NOTE — Discharge Summary (Signed)
Physician Discharge Summary  Patient ID: Jacqueline Miller MRN: [id number] DOB/AGE: [birth year] 74 y.o.  Admit date: 09/10/2019 Discharge date: 09/12/2019  Admission Diagnoses:  Total knee replacement status [Z96.659]  Surgeries:Procedure(s): Right total knee arthroplasty using computer-assisted navigation  SURGEON:  Jena Gauss. M.D.  ASSISTANT: Baldwin Jamaica, PA-C (present and scrubbed throughout the case, critical for assistance with exposure, retraction, instrumentation, and closure)  ANESTHESIA: spinal  ESTIMATED BLOOD LOSS: 50 mL  FLUIDS REPLACED: 1000 mL of crystalloid  TOURNIQUET TIME: 92 minutes  DRAINS: 2 medium Hemovac drains  SOFT TISSUE RELEASES: Anterior cruciate ligament, posterior cruciate ligament, deep medial collateral ligament, patellofemoral ligament  IMPLANTS UTILIZED: DePuy Attune size 4N posterior stabilized femoral component (cemented), size 3 rotating platform tibial component (cemented), 32 mm medialized dome patella (cemented), and a 5 mm stabilized rotating platform polyethylene insert.  Discharge Diagnoses: Patient Active Problem List   Diagnosis Date Noted  . Total knee replacement status 09/10/2019  . Headache 05/06/2019  . Meralgia paresthetica 05/06/2019  . Primary osteoarthritis of right knee 05/06/2019  . Allergic conjunctivitis and rhinitis, bilateral 06/01/2016  . Glucose intolerance (impaired glucose tolerance) 06/01/2016  . Obesity, Class II, BMI 35-39.9 06/01/2016  . OSA on CPAP 06/01/2016  . Shortness of breath 07/14/2015  . Asthma 09/10/2013  . Atrophic vaginitis 09/10/2013  . Vitamin D deficiency 09/10/2013  . PTSD (post-traumatic stress disorder) 07/18/2013    Past Medical History:  Diagnosis Date  . Anemia    H/O  . Anxiety   . Arthritis   . Asthma    WELL CONTROLLED  . Complication of anesthesia   . Dyspnea    HAS SEEN DR Gwen Pounds AND HAD STRESS TEST IN MARCH 2017 WITH NORMAL RESULTS  . GERD  (gastroesophageal reflux disease)    OCC-TUMS  . Headache    H/O MIGRAINES  . Neuropathy    legs and right side  . PONV (postoperative nausea and vomiting)    distant past  . PTSD (post-traumatic stress disorder)   . Sleep apnea    USES CPAP     Transfusion:    Consultants (if any):   Discharged Condition: Improved  Hospital Course: CORITA ALLINSON is an 74 y.o. female who was admitted 09/10/2019 with a diagnosis of right knee osteoarthritis and went to the operating room on 09/10/2019 and underwent right total knee arthroplasty. The patient received perioperative antibiotics for prophylaxis (see below). The patient tolerated the procedure well and was transported to PACU in stable condition. After meeting PACU criteria, the patient was subsequently transferred to the Orthopaedics/Rehabilitation unit.   The patient received DVT prophylaxis in the form of early mobilization, Lovenox, Foot Pumps and TED hose. A sacral pad had been placed and heels were elevated off of the bed with rolled towels in order to protect skin integrity. Foley catheter was discontinued on postoperative day #0. Wound drains were discontinued on postoperative day #2. The surgical incision was healing well without signs of infection.  Physical therapy was initiated postoperatively for transfers, gait training, and strengthening. Occupational therapy was initiated for activities of daily living and evaluation for assisted devices. Rehabilitation goals were reviewed in detail with the patient. The patient made steady progress with physical therapy and physical therapy recommended discharge to Home.   The patient achieved her preliminary goals of this hospitalization and was felt to be medically and orthopaedically appropriate for discharge.  She was given perioperative antibiotics:  Anti-infectives (From admission, onward)   Start  Dose/Rate Route Frequency Ordered Stop   09/10/19 1400  ceFAZolin (ANCEF) IVPB 2g/100  mL premix     2 g 200 mL/hr over 30 Minutes Intravenous Every 6 hours 09/10/19 1338 09/11/19 1032   09/10/19 0815  ceFAZolin (ANCEF) IVPB 2g/100 mL premix     2 g 200 mL/hr over 30 Minutes Intravenous On call to O.R. 09/10/19 0805 09/10/19 0919   09/10/19 0752  ceFAZolin (ANCEF) 2-4 GM/100ML-% IVPB    Note to Pharmacy: Renie Ora   : cabinet override      09/10/19 0752 09/11/19 1032    .  Recent vital signs:  Vitals:   09/11/19 1626 09/12/19 0010  BP: 123/60 (!) 122/59  Pulse: 66 74  Resp:  16  Temp: 98.7 F (37.1 C) 97.6 F (36.4 C)  SpO2: 94% 96%    Recent laboratory studies:  No results for input(s): WBC, HGB, HCT, PLT, K, CL, CO2, BUN, CREATININE, GLUCOSE, CALCIUM, LABPT, INR in the last 72 hours.  Diagnostic Studies: DG Knee Right Port  Result Date: 09/10/2019 CLINICAL DATA:  Status post right knee replacement today. EXAM: PORTABLE RIGHT KNEE - 1-2 VIEW COMPARISON:  None. FINDINGS: Total arthroplasty is in place. Surgical drain and staples are noted. Hardware is well positioned. No acute abnormality. IMPRESSION: Status post right knee replacement.  No acute finding. Electronically Signed   By: Drusilla Kanner M.D.   On: 09/10/2019 12:38    Discharge Medications:   Allergies as of 09/12/2019      Reactions   Other Nausea And Vomiting   TREE NUTS   Tetracycline Other (See Comments)   headaches      Medication List    TAKE these medications   albuterol 108 (90 Base) MCG/ACT inhaler Commonly known as: VENTOLIN HFA Inhale 1 puff into the lungs every 6 (six) hours as needed for wheezing or shortness of breath.   Calcipotriene 0.005 % solution Apply 1 application topically 2 (two) times daily as needed (psoriasis).   celecoxib 200 MG capsule Commonly known as: CELEBREX Take 1 capsule (200 mg total) by mouth 2 (two) times daily.   Clobex 0.05 % shampoo Generic drug: Clobetasol Propionate Apply 1 application topically daily as needed (psoriasis).     clotrimazole-betamethasone cream Commonly known as: LOTRISONE Apply 1 application topically 2 (two) times daily as needed (psoriasis).   diphenhydrAMINE 25 MG tablet Commonly known as: BENADRYL Take 25 mg by mouth 3 (three) times daily as needed for allergies.   enoxaparin 40 MG/0.4ML injection Commonly known as: LOVENOX Inject 0.4 mLs (40 mg total) into the skin daily for 14 days.   Enstilar 0.005-0.064 % Foam Generic drug: Calcipotriene-Betameth Diprop Apply 1 application topically daily as needed (psoriasis).   escitalopram 20 MG tablet Commonly known as: LEXAPRO Take 10 mg by mouth at bedtime.   Fluticasone-Salmeterol 250-50 MCG/DOSE Aepb Commonly known as: ADVAIR Inhale 1 puff into the lungs 2 (two) times daily as needed (shortness of breath).   oxyCODONE 5 MG immediate release tablet Commonly known as: Oxy IR/ROXICODONE Take 1 tablet (5 mg total) by mouth every 4 (four) hours as needed for moderate pain (pain score 4-6).   PATADAY OP Place 1 drop into both eyes daily as needed (allergies).   traMADol 50 MG tablet Commonly known as: ULTRAM Take 1 tablet (50 mg total) by mouth every 4 (four) hours as needed for moderate pain.   Vitamin D (Ergocalciferol) 1.25 MG (50000 UNIT) Caps capsule Commonly known as: DRISDOL Take 50,000 Units  by mouth every 7 (seven) days.            Durable Medical Equipment  (From admission, onward)         Start     Ordered   09/10/19 1339  DME Walker rolling  Once    Question:  Patient needs a walker to treat with the following condition  Answer:  Total knee replacement status   09/10/19 1338   09/10/19 1339  DME Bedside commode  Once    Question:  Patient needs a bedside commode to treat with the following condition  Answer:  Total knee replacement status   09/10/19 1338          Disposition: Home with home health physical therapy    Follow-up Information    [facility] Heir On 09/25/2019.   Specialty:  Orthopedic Surgery Why: at 9:15am Contact information: [place] [address] [phone]        Dereck Leep, MD On 10/23/2019.   Specialty: Orthopedic Surgery Why: at 3:00pm Contact information: [place] [address] [place], PA-C 09/12/2019, 8:08 AM

## 2019-09-12 NOTE — Progress Notes (Signed)
Pt d/c home via private vehicle with her husband.  NAD noted at d/c.  Honeycomb dressing clean dry intact.  Both TEDs on.  Bone foam, IS and polar care sent home with pt.  IV removed from pt L wrist without issue.  All belongings packed up including cell phone, clothes and CPAP.

## 2019-09-12 NOTE — Progress Notes (Signed)
  Subjective: 2 Days Post-Op Procedure(s) (LRB): COMPUTER ASSISTED TOTAL KNEE ARTHROPLASTY (Right) Patient reports pain as well-controlled.   Patient is well, and has had no acute complaints or problems Plan is to go Home after hospital stay. Negative for chest pain and shortness of breath Fever: no Gastrointestinal: negative for nausea and vomiting.   Patient has had a bowel movement.  Objective: Vital signs in last 24 hours: Temp:  [97.6 F (36.4 C)-98.7 F (37.1 C)] 97.6 F (36.4 C) (04/21 0010) Pulse Rate:  [66-74] 74 (04/21 0010) Resp:  [16] 16 (04/21 0010) BP: (122-123)/(59-60) 122/59 (04/21 0010) SpO2:  [94 %-96 %] 96 % (04/21 0010)  Intake/Output from previous day:  Intake/Output Summary (Last 24 hours) at 09/12/2019 0802 Last data filed at 09/12/2019 0654 Gross per 24 hour  Intake 1090 ml  Output 75 ml  Net 1015 ml    Intake/Output this shift: No intake/output data recorded.  Labs: No results for input(s): HGB in the last 72 hours. No results for input(s): WBC, RBC, HCT, PLT in the last 72 hours. No results for input(s): NA, K, CL, CO2, BUN, CREATININE, GLUCOSE, CALCIUM in the last 72 hours. No results for input(s): LABPT, INR in the last 72 hours.   EXAM General - Patient is Alert, Appropriate and Oriented Extremity - Neurovascular intact Dorsiflexion/Plantar flexion intact Compartment soft Dressing/Incision -Postoperative dressing remains in place., Polar Care in place and working. , Hemovac in place.  Following removal of postop dressing, significant sanguinous drainage noted over distal two thirds of honeycomb dressing. Motor Function - intact, moving foot and toes well on exam.  Able to perform straight leg raise. Cardiovascular- Regular rate and rhythm, no murmurs/rubs/gallops Respiratory- Lungs clear to auscultation bilaterally Gastrointestinal- soft and nontender   Assessment/Plan: 2 Days Post-Op Procedure(s) (LRB): COMPUTER ASSISTED TOTAL KNEE  ARTHROPLASTY (Right) Active Problems:   Total knee replacement status  Estimated body mass index is 34.58 kg/m as calculated from the following:   Height as of this encounter: 5\' 1"  (1.549 m).   Weight as of this encounter: 83 kg. Advance diet Up with therapy Discharge home with home health  Hemovac removed.  Fresh honeycomb dressing applied.  DVT Prophylaxis - Lovenox, Ted hose and foot pumps Weight-Bearing as tolerated to right leg  , PA-C [facility] 09/12/2019, 8:02 AM

## 2019-09-12 NOTE — Progress Notes (Signed)
Physical Therapy Treatment Patient Details Name: Jacqueline Miller MRN: [id number] DOB: 06-Oct-1945 Today's Date: 09/12/2019    History of Present Illness Pt is a 74 yo female s/p R TKA, WBAT. PMH of SOB, asthma, sleep apnea, anxiety, GERD.    PT Comments    Pt was in recliner upon arriving. She agrees to PT session and reports 3/10 pain in R knee. She is very motivated and cooperative throughout. She was able to stand with supervision and ambulated 200 ft using RW. Pt also demonstrated safe ability to ascend/descend 4 stair with BUE support on railing + CGA for safety. R knee AROM after performing stretching 2-89 degrees. Pt is progressing well and will benefit from continued skilled PT at DC to address ROM, strength, balance, and safe functional mobility deficits. Cleared from PT standpoint for safe DC to home.     Follow Up Recommendations  Home health PT     Equipment Recommendations  Rolling walker with 5" wheels;3in1 (PT)    Recommendations for Other Services       Precautions / Restrictions Precautions Precautions: Fall;Knee Precaution Booklet Issued: Yes (comment) Restrictions Weight Bearing Restrictions: Yes RLE Weight Bearing: Weight bearing as tolerated    Mobility  Bed Mobility               General bed mobility comments: Pt was seated up in recliner pre/post session  Transfers Overall transfer level: Needs assistance Equipment used: Rolling walker (2 wheeled) Transfers: Sit to/from Stand Sit to Stand: Supervision         General transfer comment: Supervision for safety only.   Ambulation/Gait Ambulation/Gait assistance: Supervision Gait Distance (Feet): 200 Feet Assistive device: Rolling walker (2 wheeled) Gait Pattern/deviations: Step-through pattern;Antalgic Gait velocity: decreased   General Gait Details: Pt demonstrated safe ability to ambulate 200 ft with RW with step through gait pattern however antalgic gait 2/2 to pain   Stairs Stairs:  Yes Stairs assistance: Min guard Stair Management: Two rails Number of Stairs: 4 General stair comments: pt was able to demonstrate safe ability to ascend/descend 4 stair with BUE support on rails.    Wheelchair Mobility    Modified Rankin (Stroke Patients Only)       Balance Overall balance assessment: Modified Independent         Standing balance support: Bilateral upper extremity supported Standing balance-Leahy Scale: Good                              Cognition Arousal/Alertness: Awake/alert Behavior During Therapy: WFL for tasks assessed/performed Overall Cognitive Status: Within Functional Limits for tasks assessed                                 General Comments: pt is A and O x 4 and cooperative and pleasant      Exercises Total Joint Exercises Knee Flexion: AROM;10 reps;Seated Goniometric ROM: 2-89 degrees    General Comments        Pertinent Vitals/Pain Pain Assessment: 0-10 Pain Score: 3  Pain Location: R knee Pain Descriptors / Indicators: Aching Pain Intervention(s): Limited activity within patient's tolerance;Monitored during session;Premedicated before session;Repositioned;Ice applied    Home Living                      Prior Function            PT Goals (current goals  can now be found in the care plan section) Acute Rehab PT Goals Patient Stated Goal: to go home Progress towards PT goals: Progressing toward goals    Frequency    BID      PT Plan Current plan remains appropriate    Co-evaluation              AM-PAC PT "6 Clicks" Mobility   Outcome Measure  Help needed turning from your back to your side while in a flat bed without using bedrails?: None Help needed moving from lying on your back to sitting on the side of a flat bed without using bedrails?: None Help needed moving to and from a bed to a chair (including a wheelchair)?: A Little Help needed standing up from a chair using  your arms (e.g., wheelchair or bedside chair)?: A Little Help needed to walk in hospital room?: A Little Help needed climbing 3-5 steps with a railing? : A Little 6 Click Score: 20    End of Session Equipment Utilized During Treatment: Gait belt Activity Tolerance: Patient tolerated treatment well Patient left: in chair;with call bell/phone within reach;with chair alarm set;with SCD's reapplied Nurse Communication: Mobility status PT Visit Diagnosis: Other abnormalities of gait and mobility (R26.89);Muscle weakness (generalized) (M62.81);Pain;Difficulty in walking, not elsewhere classified (R26.2) Pain - Right/Left: Right Pain - part of body: Knee     Time: 0820-0838 PT Time Calculation (min) (ACUTE ONLY): 18 min  Charges:  $Gait Training: 8-22 mins                    Jetta Lout PTA 09/12/19, 9:37 AM

## 2019-09-12 NOTE — TOC Transition Note (Signed)
Transition of Care [facility]) - CM/SW Discharge Note   Patient Details  Name: Jacqueline Miller MRN: [id number] Date of Birth: [birth year]  [facility] CM/SW Contact:  Barrie Dunker, RN Phone Number: 09/12/2019, 9:34 AM   Clinical Narrative:    Patient to DC home today via Husband, She has [facility], A RW and 3 in 1 was taken in the room provided by [employer] [facility]. She is up to date with her PCP and can afford her medications Her husband provides transportation   Final next level of care: Home w Home Health Services Barriers to Discharge: Barriers Resolved   Patient Goals and CMS Choice Patient states their goals for this hospitalization and ongoing recovery are:: get home      Discharge Placement                       Discharge Plan and Services   Discharge Planning Services: CM Consult            DME Arranged: Dan Humphreys rolling, 3-N-1 DME Agency: [employer] Date DME Agency Contacted: 09/12/19 Time DME Agency Contacted: [phone] Representative spoke with at DME Agency: Nida Boatman HH Arranged: PT HH Agency: [facility] Date HH Agency Contacted: 09/12/19 Time HH Agency Contacted: [phone] Representative spoke with at [facility] Agency: Rosey Bath  Social Determinants of Health (SDOH) Interventions     Readmission Risk Interventions No flowsheet data found.

## 2019-11-19 ENCOUNTER — Other Ambulatory Visit: Payer: Self-pay

## 2019-11-19 ENCOUNTER — Ambulatory Visit
Admission: RE | Admit: 2019-11-19 | Discharge: 2019-11-19 | Disposition: A | Discharge status: Home / Self Care | Payer: Medicare Other | Source: Ambulatory Visit | Attending: Family Medicine | Admitting: Family Medicine

## 2019-11-19 VITALS — BP 152/76 | HR 67 | Temp 98.4°F | Resp 16 | Ht 61.0 in | Wt 185.0 lb

## 2019-11-19 DIAGNOSIS — R42 Dizziness and giddiness: Secondary | ICD-10-CM | POA: Diagnosis not present

## 2019-11-19 DIAGNOSIS — N3001 Acute cystitis with hematuria: Secondary | ICD-10-CM | POA: Diagnosis not present

## 2019-11-19 LAB — URINALYSIS, COMPLETE (UACMP) WITH MICROSCOPIC
Bilirubin Urine: NEGATIVE
Glucose, UA: NEGATIVE mg/dL
Ketones, ur: NEGATIVE mg/dL
Nitrite: NEGATIVE
Protein, ur: NEGATIVE mg/dL
Specific Gravity, Urine: 1.015 (ref 1.005–1.030)
pH: 5 (ref 5.0–8.0)

## 2019-11-19 MED ORDER — CEPHALEXIN 500 MG PO CAPS: 500.0000 mg | ORAL_CAPSULE | Freq: Two times a day (BID) | ORAL | 0 refills | Status: DC

## 2019-11-19 MED ORDER — MECLIZINE HCL 25 MG PO TABS: 25.0000 mg | ORAL_TABLET | Freq: Three times a day (TID) | ORAL | 0 refills | Status: DC | PRN

## 2019-11-19 NOTE — ED Triage Notes (Signed)
Pt reports a couple days of dysuria and frequency. Also reports hx of vertigo and has another bout of it starting 3 days ago. Room spins when turning her head but none when she is still

## 2019-11-20 NOTE — ED Provider Notes (Signed)
[facility]    CSN: [id number] Arrival date & time: 11/19/19  1120  History   Chief Complaint Chief Complaint  Patient presents with  . Dysuria  . Dizziness   HPI  74 year old female presents with the above complaints.  Patient reports a 2-day history of dysuria and urinary frequency.  Also reports chills but no fever.  No significant abdominal pain.  No flank pain.  Believes that she has UTI.  Additionally, patient reports ongoing dizziness.  Started about 3 days ago.  She reports room spinning particular when she moves her head or with abrupt movements.  She has a history of vertigo.  She states that it feels the same.  No other reported symptoms.  No other complaints.  Past Medical History:  Diagnosis Date  . Anemia    H/O  . Anxiety   . Arthritis   . Asthma    WELL CONTROLLED  . Complication of anesthesia   . Dyspnea    HAS SEEN DR Gwen Pounds AND HAD STRESS TEST IN MARCH 2017 WITH NORMAL RESULTS  . GERD (gastroesophageal reflux disease)    OCC-TUMS  . Headache    H/O MIGRAINES  . Neuropathy    legs and right side  . PONV (postoperative nausea and vomiting)    distant past  . PTSD (post-traumatic stress disorder)   . Sleep apnea    USES CPAP    Patient Active Problem List   Diagnosis Date Noted  . Total knee replacement status 09/10/2019  . Headache 05/06/2019  . Meralgia paresthetica 05/06/2019  . Primary osteoarthritis of right knee 05/06/2019  . Allergic conjunctivitis and rhinitis, bilateral 06/01/2016  . Glucose intolerance (impaired glucose tolerance) 06/01/2016  . Obesity, Class II, BMI 35-39.9 06/01/2016  . OSA on CPAP 06/01/2016  . Shortness of breath 07/14/2015  . Asthma 09/10/2013  . Atrophic vaginitis 09/10/2013  . Vitamin D deficiency 09/10/2013  . PTSD (post-traumatic stress disorder) 07/18/2013    Past Surgical History:  Procedure Laterality Date  . BREAST BIOPSY Left 1998   bx/ clip- neg  . CHOLECYSTECTOMY N/A 04/09/2016    Procedure: LAPAROSCOPIC CHOLECYSTECTOMY WITH INTRAOPERATIVE CHOLANGIOGRAM;  Surgeon: Nadeen Landau, MD;  Location: [facility] ORS;  Service: General;  Laterality: N/A;  . DIAGNOSTIC LAPAROSCOPY    . DILATION AND CURETTAGE, DIAGNOSTIC / THERAPEUTIC    . KNEE ARTHROPLASTY Right 09/10/2019   Procedure: COMPUTER ASSISTED TOTAL KNEE ARTHROPLASTY;  Surgeon: Donato Heinz, MD;  Location: [facility] ORS;  Service: Orthopedics;  Laterality: Right;  . TUBAL LIGATION      OB History   No obstetric history on file.      Home Medications    Prior to Admission medications   Medication Sig Start Date End Date Taking? Authorizing Provider  Vitamin D, Ergocalciferol, (DRISDOL) 50000 units CAPS capsule Take 50,000 Units by mouth every 7 (seven) days.    Yes [provider]  albuterol (PROVENTIL HFA;VENTOLIN HFA) 108 (90 Base) MCG/ACT inhaler Inhale 1 puff into the lungs every 6 (six) hours as needed for wheezing or shortness of breath.     [provider]  Calcipotriene 0.005 % solution Apply 1 application topically 2 (two) times daily as needed (psoriasis).    [provider]  Calcipotriene-Betameth Diprop (ENSTILAR) 0.005-0.064 % FOAM Apply 1 application topically daily as needed (psoriasis).    [provider]  cephALEXin (KEFLEX) 500 MG capsule Take 1 capsule (500 mg total) by mouth 2 (two) times daily. 11/19/19   Everlene Other  G, DO  Clobetasol Propionate (CLOBEX) 0.05 % shampoo Apply 1 application topically daily as needed (psoriasis).    [provider]  clotrimazole-betamethasone (LOTRISONE) cream Apply 1 application topically 2 (two) times daily as needed (psoriasis).    [provider]  diphenhydrAMINE (BENADRYL) 25 MG tablet Take 25 mg by mouth 3 (three) times daily as needed for allergies.     [provider]  escitalopram (LEXAPRO) 20 MG tablet Take 10 mg by mouth at bedtime.     [provider]  Fluticasone-Salmeterol (ADVAIR)  250-50 MCG/DOSE AEPB Inhale 1 puff into the lungs 2 (two) times daily as needed (shortness of breath).    [provider]  meclizine (ANTIVERT) 25 MG tablet Take 1 tablet (25 mg total) by mouth 3 (three) times daily as needed for dizziness. 11/19/19   Tommie Sams, DO  Olopatadine HCl (PATADAY OP) Place 1 drop into both eyes daily as needed (allergies).    [provider]  enoxaparin (LOVENOX) 40 MG/0.4ML injection Inject 0.4 mLs (40 mg total) into the skin daily for 14 days. 09/12/19 11/19/19  Madelyn Flavors, PA-C    Family History Family History  Problem Relation Age of Onset  . Heart attack Father   . Breast cancer Neg Hx     Social History Social History   Tobacco Use  . Smoking status: Never Smoker  . Smokeless tobacco: Never Used  Vaping Use  . Vaping Use: Never used  Substance Use Topics  . Alcohol use: Yes    Comment: OCC  . Drug use: No     Allergies   Other and Tetracycline   Review of Systems Review of Systems  Constitutional: Positive for chills.  Genitourinary: Positive for dysuria and frequency.  Neurological: Positive for dizziness.   Physical Exam Triage Vital Signs ED Triage Vitals [11/19/19 1201]  Enc Vitals Group     BP (!) 152/76     Pulse Rate 67     Resp 16     Temp 98.4 F (36.9 C)     Temp Source Oral     SpO2 99 %     Weight 185 lb (83.9 kg)     Height 5\' 1"  (1.549 m)     Head Circumference      Peak Flow      Pain Score 0     Pain Loc      Pain Edu?      Excl. in GC?    No data found.  Updated Vital Signs BP (!) 152/76 (BP Location: Right Arm)   Pulse 67   Temp 98.4 F (36.9 C) (Oral)   Resp 16   Ht 5\' 1"  (1.549 m)   Wt 83.9 kg   SpO2 99%   BMI 34.96 kg/m   Visual Acuity Right Eye Distance:   Left Eye Distance:   Bilateral Distance:    Right Eye Near:   Left Eye Near:    Bilateral Near:     Physical Exam Vitals and nursing note reviewed.  Constitutional:      General: She is not in acute  distress.    Appearance: Normal appearance. She is not ill-appearing.  HENT:     Head: Normocephalic and atraumatic.  Eyes:     General:        Right eye: No discharge.        Left eye: No discharge.     Conjunctiva/sclera: Conjunctivae normal.     Pupils: Pupils are  equal, round, and reactive to light.  Cardiovascular:     Rate and Rhythm: Normal rate and regular rhythm.     Heart sounds: No murmur heard.   Pulmonary:     Effort: Pulmonary effort is normal.     Breath sounds: Normal breath sounds. No wheezing, rhonchi or rales.  Abdominal:     General: There is no distension.     Palpations: Abdomen is soft.     Tenderness: There is no abdominal tenderness.  Neurological:     Mental Status: She is alert.  Psychiatric:        Mood and Affect: Mood normal.        Behavior: Behavior normal.    UC Treatments / Results  Labs (all labs ordered are listed, but only abnormal results are displayed) Labs Reviewed  URINALYSIS, COMPLETE (UACMP) WITH MICROSCOPIC - Abnormal; Notable for the following components:      Result Value   APPearance HAZY (*)    Hgb urine dipstick MODERATE (*)    Leukocytes,Ua SMALL (*)    Bacteria, UA FEW (*)    All other components within normal limits  URINE CULTURE    EKG   Radiology No results found.  Procedures Procedures (including critical care time)  Medications Ordered in UC Medications - No data to display  Initial Impression / Assessment and Plan / UC Course  I have reviewed the triage vital signs and the nursing notes.  Pertinent labs & imaging results that were available during my care of the patient were reviewed by me and considered in my medical decision making (see chart for details).    74 year old female presents with UTI vertigo.  Treating with Keflex and meclizine.  Final Clinical Impressions(s) / UC Diagnoses   Final diagnoses:  Acute cystitis with hematuria  Vertigo   Discharge Instructions   None    ED  Prescriptions    Medication Sig Dispense Auth. Provider   cephALEXin (KEFLEX) 500 MG capsule Take 1 capsule (500 mg total) by mouth 2 (two) times daily. 14 capsule Lino Wickliff G, DO   meclizine (ANTIVERT) 25 MG tablet Take 1 tablet (25 mg total) by mouth 3 (three) times daily as needed for dizziness. 30 tablet Tommie Sams, DO     PDMP not reviewed this encounter.   Everlene Other [place], [place] 11/20/19 [phone]

## 2019-11-22 LAB — URINE CULTURE: Culture: >=100000 — AB

## 2020-04-25 ENCOUNTER — Encounter: Payer: Self-pay | Admitting: Orthopedic Surgery

## 2020-04-25 ENCOUNTER — Encounter
Admission: RE | Admit: 2020-04-25 | Discharge: 2020-04-25 | Disposition: A | Discharge status: Home / Self Care | Payer: Medicare Other | Source: Ambulatory Visit | Attending: Orthopedic Surgery | Admitting: Orthopedic Surgery

## 2020-04-25 ENCOUNTER — Other Ambulatory Visit: Payer: Self-pay

## 2020-04-25 DIAGNOSIS — Z01818 Encounter for other preprocedural examination: Secondary | ICD-10-CM | POA: Diagnosis not present

## 2020-04-25 HISTORY — DX: Depression, unspecified: F32.A

## 2020-04-25 LAB — CBC
HCT: 39.4 % (ref 36.0–46.0)
Hemoglobin: 12.7 g/dL (ref 12.0–15.0)
MCH: 27.4 pg (ref 26.0–34.0)
MCHC: 32.2 g/dL (ref 30.0–36.0)
MCV: 84.9 fL (ref 80.0–100.0)
Platelets: 127 10*3/uL — ABNORMAL LOW (ref 150–400)
RBC: 4.64 MIL/uL (ref 3.87–5.11)
RDW: 16.8 % — ABNORMAL HIGH (ref 11.5–15.5)
WBC: 4.1 10*3/uL (ref 4.0–10.5)
nRBC: 0 % (ref 0.0–0.2)

## 2020-04-25 LAB — SURGICAL PCR SCREEN
MRSA, PCR: NEGATIVE
Staphylococcus aureus: NEGATIVE

## 2020-04-25 LAB — TYPE AND SCREEN
ABO/RH(D): O POS
Antibody Screen: NEGATIVE

## 2020-04-25 LAB — APTT: aPTT: 28 seconds (ref 24–36)

## 2020-04-25 LAB — PROTIME-INR
INR: 1 (ref 0.8–1.2)
Prothrombin Time: 12.5 seconds (ref 11.4–15.2)

## 2020-04-25 LAB — COMPREHENSIVE METABOLIC PANEL
ALT: 16 U/L (ref 0–44)
AST: 17 U/L (ref 15–41)
Albumin: 4.2 g/dL (ref 3.5–5.0)
Alkaline Phosphatase: 67 U/L (ref 38–126)
Anion gap: 11 (ref 5–15)
BUN: 17 mg/dL (ref 8–23)
CO2: 23 mmol/L (ref 22–32)
Calcium: 8.8 mg/dL — ABNORMAL LOW (ref 8.9–10.3)
Chloride: 106 mmol/L (ref 98–111)
Creatinine, Ser: 0.67 mg/dL (ref 0.44–1.00)
GFR, Estimated: >60 mL/min (ref >60)
Glucose, Bld: 120 mg/dL — ABNORMAL HIGH (ref 70–99)
Potassium: 3.4 mmol/L — ABNORMAL LOW (ref 3.5–5.1)
Sodium: 140 mmol/L (ref 135–145)
Total Bilirubin: 0.7 mg/dL (ref 0.3–1.2)
Total Protein: 7.4 g/dL (ref 6.5–8.1)

## 2020-04-25 LAB — SEDIMENTATION RATE: Sed Rate: 15 mm/hr (ref 0–30)

## 2020-04-25 NOTE — Patient Instructions (Signed)
Your procedure is scheduled on: Mon 12/13 Report to Registration Desk in [facility]    Then to Day Surgery. To find out your arrival time please call [phone] between 1PM - 3PM on Friday12/10  [facility] appointment in front of [facility] between 8-1 on Thurs 12/9  Remember: Instructions that are not followed completely may result in serious medical risk,  up to and including death, or upon the discretion of your surgeon and anesthesiologist your  surgery may need to be rescheduled.     _X__ 1. Do not eat food after midnight the night before your procedure.                 No chewing gum or hard candies. You may drink clear liquids up to 2 hours                 before you are scheduled to arrive for your surgery- DO not drink clear                 liquids within 2 hours of the start of your surgery.                 Clear Liquids include:  water, apple juice without pulp, clear Gatorade, G2 or                  Gatorade Zero (avoid Red/Purple/Blue), Black Coffee or Tea (Do not add                 anything to coffee or tea). ___x__2.   Complete the "Ensure Clear Pre-surgery Clear Carbohydrate Drink" provided to you, 2 hours before arrival.  __X__2.  On the morning of surgery brush your teeth with toothpaste and water, you                may rinse your mouth with mouthwash if you wish.  Do not swallow any toothpaste of mouthwash.     _X__ 3.  No Alcohol for 24 hours before or after surgery.   ___ 4.  Do Not Smoke or use e-cigarettes For 24 Hours Prior to Your Surgery.                 Do not use any chewable tobacco products for at least 6 hours prior to                 Surgery.  ___  5.  Do not use any recreational drugs (marijuana, cocaine, heroin, ecstasy, MDMA or other)                For at least one week prior to your surgery.  Combination of these drugs with anesthesia                May have life threatening results.  ____  6.  Bring all medications with you on the  day of surgery if instructed.   __x__  7.  Notify your doctor if there is any change in your medical condition      (cold, fever, infections).     Do not wear jewelry, make-up, hairpins, clips or nail polish. Do not wear lotions, powders, or perfumes. You may wear deodorant. Do not shave 48 hours prior to surgery.  Do not bring valuables to the hospital.    [facility] is not responsible for any belongings or valuables.  Contacts, dentures or bridgework may not be worn into surgery. Leave your suitcase in the car.  After surgery it may be brought to your room. For patients admitted to the hospital, discharge time is determined by your treatment team.   Patients discharged the day of surgery will not be allowed to drive home.   Make arrangements for someone to be with you for the first 24 hours of your Same Day Discharge.    Please read over the following fact sheets that you were given:    __x__ Take these medicines the morning of surgery with A SIP OF WATER:    1. none  2.   3.   4.  5.  6.  ____ Fleet Enema (as directed)   __x__ Use CHG Soap (or wipes) as directed  ____ Use Benzoyl Peroxide Gel as instructed  __x__ Use inhalers on the day of surgeryalbuterol (PROVENTIL HFA;VENTOLIN HFA) 108 (90 Base) MCG/ACT inhaler and bring with you to the hospital     Fluticasone-Salmeterol (ADVAIR) 250-50 MCG/DOSE AEPB  ____ Stop metformin 2 days prior to surgery    ____ Take 1/2 of usual insulin dose the night before surgery. No insulin the morning          of surgery.   ____ Stop Coumadin/Plavix/aspirin  __x__ Stop Anti-inflammatories no ibuprofen aleve or aspirin products after 12/ 6    May take tylenol   ____ Stop supplements until after surgery.    __x__ Bring C-Pap to the hospital.    If you have any questions regarding your pre-procedure instructions,  Please call Pre-admit Testing at [phone]  [facility]

## 2020-04-25 NOTE — Discharge Instructions (Signed)
Instructions after Total Knee Replacement   Tanishia Lemaster P. Shanautica Forker, Jr., M.D.     Dept. of Orthopaedics & Sports Medicine  [facility]  [address]  [place], [place]  [address]  Phone: [phone]   Fax: [phone]    DIET: Drink plenty of non-alcoholic fluids. Resume your normal diet. Include foods high in fiber.  ACTIVITY:  You may use crutches or a walker with weight-bearing as tolerated, unless instructed otherwise. You may be weaned off of the walker or crutches by your Physical Therapist.  Do NOT place pillows under the knee. Anything placed under the knee could limit your ability to straighten the knee.   Continue doing gentle exercises. Exercising will reduce the pain and swelling, increase motion, and prevent muscle weakness.   Please continue to use the TED compression stockings for 6 weeks. You may remove the stockings at night, but should reapply them in the morning. Do not drive or operate any equipment until instructed.  WOUND CARE:  Continue to use the PolarCare or ice packs periodically to reduce pain and swelling. You may bathe or shower after the staples are removed at the first office visit following surgery.  MEDICATIONS: You may resume your regular medications. Please take the pain medication as prescribed on the medication. Do not take pain medication on an empty stomach. You have been given a prescription for a blood thinner (Lovenox or Coumadin). Please take the medication as instructed. (NOTE: After completing a 2 week course of Lovenox, take one Enteric-coated aspirin once a day. This along with elevation will help reduce the possibility of phlebitis in your operated leg.) Do not drive or drink alcoholic beverages when taking pain medications.  CALL THE OFFICE FOR: Temperature above 101 degrees Excessive bleeding or drainage on the dressing. Excessive swelling, coldness, or paleness of the toes. Persistent nausea and vomiting.  FOLLOW-UP:  You  should have an appointment to return to the office in 10-14 days after surgery. Arrangements have been made for continuation of Physical Therapy (either home therapy or outpatient therapy).   [facility] Department Directory         [email or web address]       [email or web address]          Cardiology  Appointments: [place] - [phone] [place] - [phone]  Endocrinology  Appointments: [place] - [phone] [place] - [phone]  Gastroenterology  Appointments: [place] - [phone] [place] - [phone]        General Surgery   Appointments: [place] - [phone]  Internal Medicine/Family Medicine  Appointments: [place] - [phone] [place] - [phone] [place] - [phone]  Metabolic and Weigh Loss Surgery  Appointments: [place] - [phone]        Neurology  Appointments: [place] - [phone] [place] - [phone]  Neurosurgery  Appointments: [place] - [phone]  Obstetrics & Gynecology  Appointments: [place] - [phone] [place] - [phone]        Pediatrics  Appointments: [place] - [phone] [place] - [phone]  Physiatry  Appointments: [place] -[phone]  Physical Therapy  Appointments: [place] - [phone] [place] - [phone]        Podiatry  Appointments: [place] - [phone] [place] - [phone]  Pulmonology  Appointments: [place] - [phone]  Rheumatology  Appointments: [place] - [phone]         Location: [facility]  [address] , [place]  [address]  [place] Location: [facility] [address] [place], [place]  [address]  [place] Location: [facility] [address] [place], [place]  [address]    

## 2020-04-26 LAB — C-REACTIVE PROTEIN: CRP: 0.7 mg/dL (ref <1.0)

## 2020-05-01 ENCOUNTER — Other Ambulatory Visit
Admission: RE | Admit: 2020-05-01 | Discharge: 2020-05-01 | Disposition: A | Discharge status: Home / Self Care | Payer: Medicare Other | Source: Ambulatory Visit | Attending: Orthopedic Surgery | Admitting: Orthopedic Surgery

## 2020-05-01 ENCOUNTER — Other Ambulatory Visit: Payer: Self-pay

## 2020-05-01 DIAGNOSIS — Z01812 Encounter for preprocedural laboratory examination: Secondary | ICD-10-CM | POA: Insufficient documentation

## 2020-05-01 DIAGNOSIS — Z20822 Contact with and (suspected) exposure to covid-19: Secondary | ICD-10-CM | POA: Insufficient documentation

## 2020-05-01 DIAGNOSIS — N3001 Acute cystitis with hematuria: Secondary | ICD-10-CM | POA: Diagnosis not present

## 2020-05-01 LAB — URINALYSIS, ROUTINE W REFLEX MICROSCOPIC
Bilirubin Urine: NEGATIVE
Glucose, UA: NEGATIVE mg/dL
Hgb urine dipstick: NEGATIVE
Ketones, ur: NEGATIVE mg/dL
Leukocytes,Ua: NEGATIVE
Nitrite: NEGATIVE
Protein, ur: NEGATIVE mg/dL
Specific Gravity, Urine: 1.027 (ref 1.005–1.030)
WBC, UA: NONE SEEN WBC/hpf (ref 0–5)
pH: 5 (ref 5.0–8.0)

## 2020-05-01 LAB — SARS CORONAVIRUS 2 (TAT 6-24 HRS): SARS Coronavirus 2: NEGATIVE

## 2020-05-02 LAB — URINE CULTURE
Culture: NO GROWTH
Special Requests: NORMAL

## 2020-05-04 ENCOUNTER — Encounter: Payer: Self-pay | Admitting: Orthopedic Surgery

## 2020-05-04 NOTE — H&P (Signed)
ORTHOPAEDIC HISTORY & PHYSICAL  Progress Notes Latanya Maudlin, PA - 04/29/2020 10:15 AM EST Chief Complaint Chief Complaint  Patient presents with  . Left Knee - Pain  . Knee Pain  LT [id number]   Reason for Visit Jacqueline Miller is a 74 y.o. who presents today for history and physical. She is to undergo a left total knee arthroplasty on 05/05/2020. Was last seen in clinic on 12/06/2019. There have been no change in her condition since that time. Patient is status post right total knee arthroplasty performed on 09/10/2019. Doing very well with the right knee. However the left knee has continued to give her increasing discomfort. She localizes the pain along the medial aspect the knee. Patient has had several intra-articular cortisone injections.  Patient states that her pain is increased pain is interfering with the quality of her life. She has elected to proceed with having a left total knee arthroplasty performed.  Past Medical History Past Medical History:  Diagnosis Date  . Allergic state  . Anxiety  . Arthritis  . Asthma without status asthmaticus, unspecified  . Mixed hyperlipidemia  . Neuropathy  10% service connected  . PTSD (post-traumatic stress disorder)  30% connected  . Sleep apnea   Past Surgical History Past Surgical History:  Procedure Laterality Date  . CATARACT EXTRACTION  . CHOLECYSTECTOMY 2016  . COLONOSCOPY W/BIOPSY N/A 09/21/2017  Procedure: COLONOSCOPY, FLEXIBLE; WITH BIOPSY, SINGLE OR MULTIPLE; Surgeon: Lavada Mesi, MD; Location: [facility] ENDO/[facility]; Service: Gastroenterology; Laterality: N/A;  . COLONOSCOPY W/REMOVAL LESIONS BY SNARE N/A 09/21/2017  Procedure: COLONOSCOPY, FLEXIBLE; WITH REMOVAL OF TUMOR(S), POLYP(S), OR OTHER LESION(S) BY SNARE TECHNIQUE; Surgeon: Lavada Mesi, MD; Location: [facility] ENDO/BRON[facility]; Service: Gastroenterology; Laterality: N/A;  . Right total knee arthroplasty using computer-assisted navigation 09/10/2019  Dr  Ernest Pine  . TUBAL LIGATION   Past Family History Family History  Problem Relation Age of Onset  . Osteoarthritis Mother  . Osteoporosis (Thinning of bones) Mother  . Other Mother  Dementia  . Coronary Artery Disease (Blocked arteries around heart) Father  . Diabetes type II Father  . Obesity Father  . Alzheimer's disease Maternal Grandmother  . Pancreatic cancer Paternal Grandmother  . Coronary Artery Disease (Blocked arteries around heart) Paternal Grandfather   Medications Current Outpatient Medications Ordered in Epic  Medication Sig Dispense Refill  . albuterol (PROAIR HFA) 90 mcg/actuation inhaler Inhale 2 inhalations into the lungs every 6 (six) hours as needed for Wheezing 25.5 g 3  . calcipotriene (DOVONEX) 0.005 % cream Apply 1 Application topically 2 (two) times daily  . cephalexin (KEFLEX) 500 MG capsule Take 1 capsule (500 mg total) by mouth 3 (three) times daily for 7 days 21 capsule 0  . clobetasoL (CLOBEX) 0.05 % shampoo Apply topically once daily 118 mL 5  . diclofenac (VOLTAREN) 1 % topical gel Apply 2 g topically 4 (four) times daily  . diphenhydrAMINE (BENADRYL) 25 mg tablet Take 25 mg by mouth nightly as needed  . ENSTILAR 0.005-0.064 % Foam Apply 1 Application topically once daily Use as directed  . ergocalciferol, vitamin D2, 1,250 mcg (50,000 unit) capsule Take 1 capsule (50,000 Units total) by mouth once a week 12 capsule 1  . escitalopram oxalate (LEXAPRO) 20 MG tablet Take 1 tablet (20 mg total) by mouth once daily 90 tablet 3  . fluticasone propion-salmeteroL (ADVAIR DISKUS) 250-50 mcg/dose diskus inhaler Inhale 1 inhalation into the lungs every 12 (twelve) hours 3 Inhaler 12  . clotrimazole-betamethasone (LOTRISONE) 1-0.05 % cream Apply  1 Application topically as directed  . pumpkin seed extract/soy germ (AZO BLADDER CONTROL ORAL) Take by mouth (Patient not taking: Reported on 04/29/2020 )  . UNABLE TO FIND CPAP machine   No current Epic-ordered  facility-administered medications on file.   Allergies Allergies  Allergen Reactions  . Tetracycline Headache  . Tree Nuts Nausea    Review of Systems A comprehensive 14 point ROS was performed, reviewed, and the pertinent orthopaedic findings are documented in the HPI.  Exam BP 120/70  Ht 152.4 cm (5')  Wt 92.8 kg (204 lb 9.6 oz)  BMI 39.96 kg/m   General: Well-developed well-nourished female seen in no acute distress.   HEENT: Atraumatic,normocephalic. Pupils are equal and reactive to light. Oropharynx is clear with moist mucosa  Lungs: Clear to auscultation bilaterally   Cardiovascular: Regular rate and rhythm. Normal S1, S2. No murmurs. No appreciable gallops or rubs. Peripheral pulses are palpable.  Abdomen: Soft, non-tender, nondistended. Bowel sounds present  Extremity: Left knee exam She is noted to have some mild swelling. No increased warmth redness or any signs of infection. She has normal alignment. Is noted to be tender to palpation along both the medial lateral joint line. Range of motion of the left knee had -10 degrees to 122 degrees. McMurray's is unremarkable. Neurovascular and neurosensory intact and within normal limits.   Neurological:  The patient is alert and oriented Sensation to light touch appears to be intact and within normal limits Gross motor strength appeared to be equal to 5/5  Vascular :  Peripheral pulses felt to be palpable. Capillary refill appears to be intact and within normal limits  X-ray  AP standing, lateral sunrise view of the left knee ordered and interpreted on today's visit shows near bone-on-bone to the medial compartment space. She is noted to have tricompartmental degenerative arthrosis. Some early osteophyte formation subchondral sclerosis was noted.  Impression  1. Degenerative arthrosis left knee  Plan   1. Patient currently taking no anticoagulation medication 2. Patient return to clinic 2 weeks  postop 3. Patient is planning on spending the night postoperatively. 4. Did discuss postop rehabilitation  This note was generated in part with voice recognition software and I apologize for any typographical errors that were not detected and corrected   Tera Partridge PA  Electronically signed by Latanya Maudlin, PA at 04/29/2020 10:44 AM EST

## 2020-05-05 ENCOUNTER — Encounter
Admission: RE | Disposition: A | Discharge status: Home Health | Payer: Self-pay | Source: Home / Self Care | Attending: Orthopedic Surgery

## 2020-05-05 ENCOUNTER — Inpatient Hospital Stay: Payer: Medicare Other | Admitting: Certified Registered"

## 2020-05-05 ENCOUNTER — Inpatient Hospital Stay
Admission: RE | Admit: 2020-05-05 | Discharge: 2020-05-07 | DRG: 470 | Disposition: A | Discharge status: Home Health | Payer: Medicare Other | Attending: Orthopedic Surgery | Admitting: Orthopedic Surgery

## 2020-05-05 ENCOUNTER — Inpatient Hospital Stay: Payer: Medicare Other

## 2020-05-05 ENCOUNTER — Encounter: Payer: Self-pay | Admitting: Orthopedic Surgery

## 2020-05-05 ENCOUNTER — Other Ambulatory Visit: Payer: Self-pay

## 2020-05-05 DIAGNOSIS — F32A Depression, unspecified: Secondary | ICD-10-CM | POA: Diagnosis present

## 2020-05-05 DIAGNOSIS — E782 Mixed hyperlipidemia: Secondary | ICD-10-CM | POA: Diagnosis present

## 2020-05-05 DIAGNOSIS — Z9849 Cataract extraction status, unspecified eye: Secondary | ICD-10-CM

## 2020-05-05 DIAGNOSIS — Z833 Family history of diabetes mellitus: Secondary | ICD-10-CM | POA: Diagnosis not present

## 2020-05-05 DIAGNOSIS — Z820 Family history of epilepsy and other diseases of the nervous system: Secondary | ICD-10-CM | POA: Diagnosis not present

## 2020-05-05 DIAGNOSIS — Z8262 Family history of osteoporosis: Secondary | ICD-10-CM

## 2020-05-05 DIAGNOSIS — Z79899 Other long term (current) drug therapy: Secondary | ICD-10-CM

## 2020-05-05 DIAGNOSIS — Z8249 Family history of ischemic heart disease and other diseases of the circulatory system: Secondary | ICD-10-CM

## 2020-05-05 DIAGNOSIS — Z6838 Body mass index (BMI) 38.0-38.9, adult: Secondary | ICD-10-CM

## 2020-05-05 DIAGNOSIS — F4310 Post-traumatic stress disorder, unspecified: Secondary | ICD-10-CM | POA: Diagnosis present

## 2020-05-05 DIAGNOSIS — E669 Obesity, unspecified: Secondary | ICD-10-CM | POA: Diagnosis present

## 2020-05-05 DIAGNOSIS — K219 Gastro-esophageal reflux disease without esophagitis: Secondary | ICD-10-CM | POA: Diagnosis present

## 2020-05-05 DIAGNOSIS — G629 Polyneuropathy, unspecified: Secondary | ICD-10-CM | POA: Diagnosis present

## 2020-05-05 DIAGNOSIS — J45909 Unspecified asthma, uncomplicated: Secondary | ICD-10-CM | POA: Diagnosis present

## 2020-05-05 DIAGNOSIS — Z96659 Presence of unspecified artificial knee joint: Secondary | ICD-10-CM

## 2020-05-05 DIAGNOSIS — G4730 Sleep apnea, unspecified: Secondary | ICD-10-CM | POA: Diagnosis present

## 2020-05-05 DIAGNOSIS — M1712 Unilateral primary osteoarthritis, left knee: Principal | ICD-10-CM | POA: Diagnosis present

## 2020-05-05 HISTORY — PX: KNEE ARTHROPLASTY: SHX992

## 2020-05-05 SURGERY — ARTHROPLASTY, KNEE, TOTAL, USING IMAGELESS COMPUTER-ASSISTED NAVIGATION
Anesthesia: Spinal | Site: Knee | Laterality: Left

## 2020-05-05 MED ORDER — FENTANYL CITRATE (PF) 100 MCG/2ML IJ SOLN: 25.0000 ug | INTRAMUSCULAR | Status: DC | PRN

## 2020-05-05 MED ORDER — FENTANYL CITRATE (PF) 100 MCG/2ML IJ SOLN
INTRAMUSCULAR | Status: AC
  Filled 2020-05-05: qty 2

## 2020-05-05 MED ORDER — FAMOTIDINE 20 MG PO TABS
ORAL_TABLET | ORAL | Status: AC
  Administered 2020-05-05: 10:00:00 20 mg
  Filled 2020-05-05: qty 1

## 2020-05-05 MED ORDER — SODIUM CHLORIDE 0.9 % IV SOLN: INTRAVENOUS | Status: DC

## 2020-05-05 MED ORDER — FAMOTIDINE 20 MG PO TABS: 20.0000 mg | ORAL_TABLET | Freq: Once | ORAL | Status: AC

## 2020-05-05 MED ORDER — CELECOXIB 200 MG PO CAPS
ORAL_CAPSULE | ORAL | Status: AC
  Administered 2020-05-05: 10:00:00 400 mg via ORAL
  Filled 2020-05-05: qty 2

## 2020-05-05 MED ORDER — GABAPENTIN 300 MG PO CAPS
ORAL_CAPSULE | ORAL | Status: AC
  Administered 2020-05-05: 10:00:00 300 mg via ORAL
  Filled 2020-05-05: qty 1

## 2020-05-05 MED ORDER — ALUM & MAG HYDROXIDE-SIMETH 200-200-20 MG/5ML PO SUSP: 30.0000 mL | ORAL | Status: DC | PRN

## 2020-05-05 MED ORDER — CEFAZOLIN SODIUM-DEXTROSE 2-4 GM/100ML-% IV SOLN
2.0000 g | Freq: Four times a day (QID) | INTRAVENOUS | Status: AC
  Administered 2020-05-05 (×2): 2 g via INTRAVENOUS
  Filled 2020-05-05 (×2): qty 100

## 2020-05-05 MED ORDER — ACETAMINOPHEN 10 MG/ML IV SOLN
1000.0000 mg | Freq: Four times a day (QID) | INTRAVENOUS | Status: AC
  Administered 2020-05-05 – 2020-05-06 (×4): 1000 mg via INTRAVENOUS
  Filled 2020-05-05 (×4): qty 100

## 2020-05-05 MED ORDER — SODIUM CHLORIDE FLUSH 0.9 % IV SOLN
INTRAVENOUS | Status: AC
  Filled 2020-05-05: qty 40

## 2020-05-05 MED ORDER — PROPOFOL 500 MG/50ML IV EMUL
INTRAVENOUS | Status: AC
  Filled 2020-05-05: qty 50

## 2020-05-05 MED ORDER — CHLORHEXIDINE GLUCONATE 0.12 % MT SOLN: 15.0000 mL | Freq: Once | OROMUCOSAL | Status: AC

## 2020-05-05 MED ORDER — GABAPENTIN 300 MG PO CAPS: 300.0000 mg | ORAL_CAPSULE | Freq: Once | ORAL | Status: AC

## 2020-05-05 MED ORDER — CEFAZOLIN SODIUM-DEXTROSE 2-4 GM/100ML-% IV SOLN
2.0000 g | INTRAVENOUS | Status: AC
  Administered 2020-05-05: 2 g via INTRAVENOUS

## 2020-05-05 MED ORDER — FLEET ENEMA 7-19 GM/118ML RE ENEM: 1.0000 | ENEMA | Freq: Once | RECTAL | Status: DC | PRN

## 2020-05-05 MED ORDER — ORAL CARE MOUTH RINSE: 15.0000 mL | Freq: Once | OROMUCOSAL | Status: AC

## 2020-05-05 MED ORDER — VITAMIN D (ERGOCALCIFEROL) 1.25 MG (50000 UNIT) PO CAPS
50000.0000 [IU] | ORAL_CAPSULE | ORAL | Status: DC
  Filled 2020-05-05: qty 1

## 2020-05-05 MED ORDER — ALBUTEROL SULFATE HFA 108 (90 BASE) MCG/ACT IN AERS
1.0000 | INHALATION_SPRAY | Freq: Four times a day (QID) | RESPIRATORY_TRACT | Status: DC | PRN
  Filled 2020-05-05: qty 6.7

## 2020-05-05 MED ORDER — LIDOCAINE HCL (PF) 2 % IJ SOLN
INTRAMUSCULAR | Status: DC | PRN
  Administered 2020-05-05: 50 mg

## 2020-05-05 MED ORDER — ONDANSETRON HCL 4 MG/2ML IJ SOLN: 4.0000 mg | Freq: Four times a day (QID) | INTRAMUSCULAR | Status: DC | PRN

## 2020-05-05 MED ORDER — HYDROMORPHONE HCL 1 MG/ML IJ SOLN
0.5000 mg | INTRAMUSCULAR | Status: DC | PRN
Start: 2020-05-05 — End: 2020-05-07

## 2020-05-05 MED ORDER — TRANEXAMIC ACID-NACL 1000-0.7 MG/100ML-% IV SOLN: 1000.0000 mg | Freq: Once | INTRAVENOUS | Status: AC

## 2020-05-05 MED ORDER — DEXAMETHASONE SODIUM PHOSPHATE 10 MG/ML IJ SOLN
INTRAMUSCULAR | Status: AC
  Administered 2020-05-05: 11:00:00 8 mg via INTRAVENOUS
  Filled 2020-05-05: qty 1

## 2020-05-05 MED ORDER — BUPIVACAINE HCL (PF) 0.25 % IJ SOLN
INTRAMUSCULAR | Status: DC | PRN
  Administered 2020-05-05: 60 mL

## 2020-05-05 MED ORDER — LIDOCAINE HCL (PF) 2 % IJ SOLN
INTRAMUSCULAR | Status: AC
  Filled 2020-05-05: qty 5

## 2020-05-05 MED ORDER — FERROUS SULFATE 325 (65 FE) MG PO TABS
325.0000 mg | ORAL_TABLET | Freq: Two times a day (BID) | ORAL | Status: DC
  Administered 2020-05-05 – 2020-05-07 (×4): 325 mg via ORAL
  Filled 2020-05-05 (×4): qty 1

## 2020-05-05 MED ORDER — DIPHENHYDRAMINE HCL 25 MG PO CAPS: 25.0000 mg | ORAL_CAPSULE | Freq: Three times a day (TID) | ORAL | Status: DC | PRN

## 2020-05-05 MED ORDER — TRANEXAMIC ACID-NACL 1000-0.7 MG/100ML-% IV SOLN
INTRAVENOUS | Status: AC
  Administered 2020-05-05: 15:00:00 1000 mg via INTRAVENOUS
  Filled 2020-05-05: qty 100

## 2020-05-05 MED ORDER — SODIUM CHLORIDE 0.9 % IV SOLN
INTRAVENOUS | Status: DC | PRN
  Administered 2020-05-05: 14:00:00 60 mL

## 2020-05-05 MED ORDER — LACTATED RINGERS IV SOLN: INTRAVENOUS | Status: DC

## 2020-05-05 MED ORDER — ESCITALOPRAM OXALATE 10 MG PO TABS
20.0000 mg | ORAL_TABLET | Freq: Every day | ORAL | Status: DC
  Administered 2020-05-05 – 2020-05-06 (×2): 20 mg via ORAL
  Filled 2020-05-05 (×3): qty 2

## 2020-05-05 MED ORDER — MOMETASONE FURO-FORMOTEROL FUM 200-5 MCG/ACT IN AERO
2.0000 | INHALATION_SPRAY | Freq: Two times a day (BID) | RESPIRATORY_TRACT | Status: DC
  Administered 2020-05-05 – 2020-05-07 (×4): 2 via RESPIRATORY_TRACT
  Filled 2020-05-05: qty 8.8

## 2020-05-05 MED ORDER — METOCLOPRAMIDE HCL 10 MG PO TABS
10.0000 mg | ORAL_TABLET | Freq: Three times a day (TID) | ORAL | Status: DC
  Administered 2020-05-05 – 2020-05-07 (×5): 10 mg via ORAL
  Filled 2020-05-05 (×6): qty 1

## 2020-05-05 MED ORDER — ACETAMINOPHEN 10 MG/ML IV SOLN
INTRAVENOUS | Status: DC | PRN
  Administered 2020-05-05: 1000 mg via INTRAVENOUS

## 2020-05-05 MED ORDER — CELECOXIB 200 MG PO CAPS
200.0000 mg | ORAL_CAPSULE | Freq: Two times a day (BID) | ORAL | Status: DC
  Administered 2020-05-05 – 2020-05-07 (×4): 200 mg via ORAL
  Filled 2020-05-05 (×4): qty 1

## 2020-05-05 MED ORDER — ENSURE PRE-SURGERY PO LIQD
296.0000 mL | Freq: Once | ORAL | Status: DC
  Filled 2020-05-05: qty 296

## 2020-05-05 MED ORDER — ENOXAPARIN SODIUM 30 MG/0.3ML ~~LOC~~ SOLN
30.0000 mg | Freq: Two times a day (BID) | SUBCUTANEOUS | Status: DC
  Administered 2020-05-06 – 2020-05-07 (×3): 30 mg via SUBCUTANEOUS
  Filled 2020-05-05 (×3): qty 0.3

## 2020-05-05 MED ORDER — DIPHENHYDRAMINE HCL 12.5 MG/5ML PO ELIX: 12.5000 mg | ORAL_SOLUTION | ORAL | Status: DC | PRN

## 2020-05-05 MED ORDER — GLYCOPYRROLATE 0.2 MG/ML IJ SOLN
INTRAMUSCULAR | Status: AC
  Filled 2020-05-05: qty 1

## 2020-05-05 MED ORDER — MIDAZOLAM HCL 2 MG/2ML IJ SOLN
INTRAMUSCULAR | Status: AC
  Filled 2020-05-05: qty 2

## 2020-05-05 MED ORDER — MAGNESIUM HYDROXIDE 400 MG/5ML PO SUSP
30.0000 mL | Freq: Every day | ORAL | Status: DC
  Administered 2020-05-05 – 2020-05-06 (×2): 30 mL via ORAL
  Filled 2020-05-05 (×3): qty 30

## 2020-05-05 MED ORDER — BUPIVACAINE LIPOSOME 1.3 % IJ SUSP
INTRAMUSCULAR | Status: AC
  Filled 2020-05-05: qty 20

## 2020-05-05 MED ORDER — BUPIVACAINE HCL (PF) 0.25 % IJ SOLN
INTRAMUSCULAR | Status: AC
  Filled 2020-05-05: qty 60

## 2020-05-05 MED ORDER — ONDANSETRON HCL 4 MG PO TABS: 4.0000 mg | ORAL_TABLET | Freq: Four times a day (QID) | ORAL | Status: DC | PRN

## 2020-05-05 MED ORDER — SURGIPHOR WOUND IRRIGATION SYSTEM - OPTIME
TOPICAL | Status: DC | PRN
  Administered 2020-05-05: 1 via TOPICAL

## 2020-05-05 MED ORDER — GLYCOPYRROLATE 0.2 MG/ML IJ SOLN
INTRAMUSCULAR | Status: DC | PRN
  Administered 2020-05-05: .2 mg via INTRAVENOUS

## 2020-05-05 MED ORDER — CELECOXIB 200 MG PO CAPS: 400.0000 mg | ORAL_CAPSULE | Freq: Once | ORAL | Status: AC

## 2020-05-05 MED ORDER — ACETAMINOPHEN 10 MG/ML IV SOLN
INTRAVENOUS | Status: AC
  Filled 2020-05-05: qty 100

## 2020-05-05 MED ORDER — TRAMADOL HCL 50 MG PO TABS
50.0000 mg | ORAL_TABLET | ORAL | Status: DC | PRN
  Administered 2020-05-06 (×2): 50 mg via ORAL
  Filled 2020-05-05 (×2): qty 1

## 2020-05-05 MED ORDER — MENTHOL 3 MG MT LOZG
1.0000 | LOZENGE | OROMUCOSAL | Status: DC | PRN
  Filled 2020-05-05: qty 9

## 2020-05-05 MED ORDER — PROPOFOL 10 MG/ML IV BOLUS
INTRAVENOUS | Status: AC
  Filled 2020-05-05: qty 20

## 2020-05-05 MED ORDER — PROPOFOL 500 MG/50ML IV EMUL
INTRAVENOUS | Status: DC | PRN
  Administered 2020-05-05: 35 ug/kg/min via INTRAVENOUS

## 2020-05-05 MED ORDER — ONDANSETRON HCL 4 MG/2ML IJ SOLN: 4.0000 mg | Freq: Once | INTRAMUSCULAR | Status: DC | PRN

## 2020-05-05 MED ORDER — ACETAMINOPHEN 325 MG PO TABS
325.0000 mg | ORAL_TABLET | Freq: Four times a day (QID) | ORAL | Status: DC | PRN
  Administered 2020-05-06: 650 mg via ORAL
  Filled 2020-05-05: qty 2

## 2020-05-05 MED ORDER — FENTANYL CITRATE (PF) 100 MCG/2ML IJ SOLN
INTRAMUSCULAR | Status: DC | PRN
  Administered 2020-05-05 (×2): 50 ug via INTRAVENOUS

## 2020-05-05 MED ORDER — MIDAZOLAM HCL 5 MG/5ML IJ SOLN
INTRAMUSCULAR | Status: DC | PRN
  Administered 2020-05-05: 2 mg via INTRAVENOUS

## 2020-05-05 MED ORDER — CHLORHEXIDINE GLUCONATE 4 % EX LIQD: 60.0000 mL | Freq: Once | CUTANEOUS | Status: DC

## 2020-05-05 MED ORDER — PHENOL 1.4 % MT LIQD
1.0000 | OROMUCOSAL | Status: DC | PRN
  Filled 2020-05-05: qty 177

## 2020-05-05 MED ORDER — OXYCODONE HCL 5 MG PO TABS: 5.0000 mg | ORAL_TABLET | ORAL | Status: DC | PRN

## 2020-05-05 MED ORDER — CEFAZOLIN SODIUM-DEXTROSE 2-4 GM/100ML-% IV SOLN
INTRAVENOUS | Status: AC
  Filled 2020-05-05: qty 100

## 2020-05-05 MED ORDER — SENNOSIDES-DOCUSATE SODIUM 8.6-50 MG PO TABS
1.0000 | ORAL_TABLET | Freq: Two times a day (BID) | ORAL | Status: DC
  Administered 2020-05-05 – 2020-05-06 (×3): 1 via ORAL
  Filled 2020-05-05 (×4): qty 1

## 2020-05-05 MED ORDER — TRANEXAMIC ACID-NACL 1000-0.7 MG/100ML-% IV SOLN
1000.0000 mg | INTRAVENOUS | Status: AC
  Administered 2020-05-05: 12:00:00 1000 mg via INTRAVENOUS

## 2020-05-05 MED ORDER — DEXAMETHASONE SODIUM PHOSPHATE 10 MG/ML IJ SOLN: 8.0000 mg | Freq: Once | INTRAMUSCULAR | Status: AC

## 2020-05-05 MED ORDER — TRANEXAMIC ACID-NACL 1000-0.7 MG/100ML-% IV SOLN
INTRAVENOUS | Status: AC
  Filled 2020-05-05: qty 100

## 2020-05-05 MED ORDER — PANTOPRAZOLE SODIUM 40 MG PO TBEC
40.0000 mg | DELAYED_RELEASE_TABLET | Freq: Two times a day (BID) | ORAL | Status: DC
  Administered 2020-05-05 – 2020-05-07 (×4): 40 mg via ORAL
  Filled 2020-05-05 (×4): qty 1

## 2020-05-05 MED ORDER — BISACODYL 10 MG RE SUPP: 10.0000 mg | Freq: Every day | RECTAL | Status: DC | PRN

## 2020-05-05 MED ORDER — SODIUM CHLORIDE 0.9 % IR SOLN
Status: DC | PRN
  Administered 2020-05-05: 14:00:00 3500 mL

## 2020-05-05 MED ORDER — CELECOXIB 200 MG PO CAPS
ORAL_CAPSULE | ORAL | Status: AC
  Filled 2020-05-05: qty 1

## 2020-05-05 MED ORDER — BUPIVACAINE HCL (PF) 0.5 % IJ SOLN
INTRAMUSCULAR | Status: DC | PRN
  Administered 2020-05-05: 2.7 mL via INTRATHECAL

## 2020-05-05 MED ORDER — CHLORHEXIDINE GLUCONATE 0.12 % MT SOLN
OROMUCOSAL | Status: AC
  Administered 2020-05-05: 10:00:00 15 mL via OROMUCOSAL
  Filled 2020-05-05: qty 15

## 2020-05-05 MED ORDER — GABAPENTIN 300 MG PO CAPS
300.0000 mg | ORAL_CAPSULE | Freq: Every day | ORAL | Status: DC
  Administered 2020-05-05 – 2020-05-06 (×2): 300 mg via ORAL
  Filled 2020-05-05: qty 1
  Filled 2020-05-05: qty 3

## 2020-05-05 SURGICAL SUPPLY — 78 items
ATTUNE MED DOME PAT 32 KNEE (Knees) ×1 IMPLANT
ATTUNE MED DOME PAT 32MM KNEE (Knees) ×1 IMPLANT
ATTUNE PSFEM LTSZ4 NARCEM KNEE (Femur) ×2 IMPLANT
ATTUNE PSRP INSR SZ4 5 KNEE (Insert) ×1 IMPLANT
ATTUNE PSRP INSR SZ4 5MM KNEE (Insert) ×1 IMPLANT
BASE TIBIAL ROT PLAT SZ 3 KNEE (Knees) IMPLANT
BATTERY INSTRU NAVIGATION (MISCELLANEOUS) ×12 IMPLANT
BLADE SAW 70X12.5 (BLADE) ×3 IMPLANT
BLADE SAW 90X13X1.19 OSCILLAT (BLADE) ×3 IMPLANT
BLADE SAW 90X25X1.19 OSCILLAT (BLADE) ×3 IMPLANT
CANISTER PREVENA PLUS 150 (CANNISTER) ×3 IMPLANT
CANISTER SUCT 3000ML PPV (MISCELLANEOUS) ×3 IMPLANT
CEMENT HV SMART SET (Cement) ×4 IMPLANT
COOLER POLAR GLACIER W/PUMP (MISCELLANEOUS) ×3 IMPLANT
COVER WAND RF STERILE (DRAPES) ×3 IMPLANT
CUFF TOURN SGL QUICK 30 (TOURNIQUET CUFF) ×2
CUFF TRNQT CYL 30X4X21-28X (TOURNIQUET CUFF) IMPLANT
DRAPE 3/4 80X56 (DRAPES) ×3 IMPLANT
DRSG DERMACEA 8X12 NADH (GAUZE/BANDAGES/DRESSINGS) ×5 IMPLANT
DRSG MEPILEX SACRM 8.7X9.8 (GAUZE/BANDAGES/DRESSINGS) ×3 IMPLANT
DRSG OPSITE POSTOP 4X14 (GAUZE/BANDAGES/DRESSINGS) ×3 IMPLANT
DRSG TEGADERM 4X4.75 (GAUZE/BANDAGES/DRESSINGS) ×3 IMPLANT
DURAPREP 26ML APPLICATOR (WOUND CARE) ×6 IMPLANT
ELECT REM PT RETURN 9FT ADLT (ELECTROSURGICAL) ×3
ELECTRODE REM PT RTRN 9FT ADLT (ELECTROSURGICAL) ×1 IMPLANT
EX-PIN ORTHOLOCK NAV 4X150 (PIN) ×6 IMPLANT
GLOVE BIO SURGEON STRL SZ7.5 (GLOVE) ×6 IMPLANT
GLOVE BIOGEL M STRL SZ7.5 (GLOVE) ×6 IMPLANT
GLOVE BIOGEL PI IND STRL 7.5 (GLOVE) ×1 IMPLANT
GLOVE BIOGEL PI INDICATOR 7.5 (GLOVE) ×2
GLOVE INDICATOR 8.0 STRL GRN (GLOVE) ×3 IMPLANT
GOWN STRL REUS W/ TWL LRG LVL3 (GOWN DISPOSABLE) ×2 IMPLANT
GOWN STRL REUS W/ TWL XL LVL3 (GOWN DISPOSABLE) ×1 IMPLANT
GOWN STRL REUS W/TWL LRG LVL3 (GOWN DISPOSABLE) ×4
GOWN STRL REUS W/TWL XL LVL3 (GOWN DISPOSABLE) ×2
HEMOVAC 400CC 10FR (MISCELLANEOUS) ×3 IMPLANT
HOLDER FOLEY CATH W/STRAP (MISCELLANEOUS) ×3 IMPLANT
HOOD PEEL AWAY FLYTE STAYCOOL (MISCELLANEOUS) ×6 IMPLANT
IRRIGATION SURGIPHOR STRL (IV SOLUTION) ×3 IMPLANT
KIT PREVENA INCISION MGT20CM45 (CANNISTER) ×3 IMPLANT
KIT PUMP PREVENA PLUS 14DAY (MISCELLANEOUS) ×3 IMPLANT
KIT TURNOVER KIT A (KITS) ×3 IMPLANT
KNIFE SCULPS 14X20 (INSTRUMENTS) ×3 IMPLANT
LABEL OR SOLS (LABEL) ×3 IMPLANT
MANIFOLD NEPTUNE II (INSTRUMENTS) ×6 IMPLANT
NDL SAFETY ECLIPSE 18X1.5 (NEEDLE) ×1 IMPLANT
NDL SPNL 20GX3.5 QUINCKE YW (NEEDLE) ×2 IMPLANT
NEEDLE HYPO 18GX1.5 SHARP (NEEDLE) ×2
NEEDLE SPNL 20GX3.5 QUINCKE YW (NEEDLE) ×6 IMPLANT
NS IRRIG 500ML POUR BTL (IV SOLUTION) ×3 IMPLANT
PACK TOTAL KNEE (MISCELLANEOUS) ×3 IMPLANT
PAD ABD DERMACEA PRESS 5X9 (GAUZE/BANDAGES/DRESSINGS) ×4 IMPLANT
PAD WRAPON POLAR KNEE (MISCELLANEOUS) ×1 IMPLANT
PENCIL SMOKE ULTRAEVAC 22 CON (MISCELLANEOUS) ×3 IMPLANT
PIN DRILL QUICK PACK ×3 IMPLANT
PIN FIXATION 1/8DIA X 3INL (PIN) ×9 IMPLANT
PULSAVAC PLUS IRRIG FAN TIP (DISPOSABLE) ×3
SOL .9 NS 3000ML IRR  AL (IV SOLUTION) ×2
SOL .9 NS 3000ML IRR UROMATIC (IV SOLUTION) ×1 IMPLANT
SOL PREP PVP 2OZ (MISCELLANEOUS) ×3
SOLUTION PREP PVP 2OZ (MISCELLANEOUS) ×1 IMPLANT
SPONGE DRAIN TRACH 4X4 STRL 2S (GAUZE/BANDAGES/DRESSINGS) ×3 IMPLANT
STAPLER SKIN PROX 35W (STAPLE) ×3 IMPLANT
STOCKINETTE IMPERV 14X48 (MISCELLANEOUS) ×2 IMPLANT
STRAP TIBIA SHORT (MISCELLANEOUS) ×3 IMPLANT
SUCTION FRAZIER HANDLE 10FR (MISCELLANEOUS) ×2
SUCTION TUBE FRAZIER 10FR DISP (MISCELLANEOUS) ×1 IMPLANT
SUT VIC AB 0 CT1 36 (SUTURE) ×6 IMPLANT
SUT VIC AB 1 CT1 36 (SUTURE) ×6 IMPLANT
SUT VIC AB 2-0 CT2 27 (SUTURE) ×3 IMPLANT
SYR 20ML LL LF (SYRINGE) ×3 IMPLANT
SYR 30ML LL (SYRINGE) ×6 IMPLANT
TIBIAL BASE ROT PLAT SZ 3 KNEE (Knees) ×3 IMPLANT
TIP FAN IRRIG PULSAVAC PLUS (DISPOSABLE) ×1 IMPLANT
TOWEL OR 17X26 4PK STRL BLUE (TOWEL DISPOSABLE) ×3 IMPLANT
TOWER CARTRIDGE SMART MIX (DISPOSABLE) ×3 IMPLANT
TRAY FOLEY MTR SLVR 16FR STAT (SET/KITS/TRAYS/PACK) ×3 IMPLANT
WRAPON POLAR PAD KNEE (MISCELLANEOUS) ×3

## 2020-05-05 NOTE — Transfer of Care (Signed)
Immediate Anesthesia Transfer of Care Note  Patient: Jacqueline Miller  Procedure(s) Performed: COMPUTER ASSISTED TOTAL KNEE ARTHROPLASTY (Left Knee)  Patient Location: PACU  Anesthesia Type:Spinal  Level of Consciousness: awake, alert  and oriented  Airway & Oxygen Therapy: Patient Spontanous Breathing  Post-op Assessment: Report given to RN and Post -op Vital signs reviewed and stable  Post vital signs: Reviewed  Last Vitals:  Vitals Value Taken Time  BP    Temp    Pulse    Resp    SpO2      Last Pain:  Vitals:   05/05/20 1029  TempSrc: Oral  PainSc: 0-No pain         Complications: No complications documented.

## 2020-05-05 NOTE — H&P (Signed)
The patient has been re-examined, and the chart reviewed, and there have been no interval changes to the documented history and physical.    The risks, benefits, and alternatives have been discussed at length. The patient expressed understanding of the risks benefits and agreed with plans for surgical intervention.  Tekela Garguilo P. Harry Shuck, Jr. M.D.    

## 2020-05-05 NOTE — Anesthesia Procedure Notes (Signed)
Spinal  Patient location during procedure: OR Staffing Performed: resident/CRNA  Anesthesiologist: Molli Barrows, MD Resident/CRNA: Rolla Plate, CRNA Preanesthetic Checklist Completed: patient identified, IV checked, site marked, risks and benefits discussed, surgical consent, monitors and equipment checked, pre-op evaluation and timeout performed Spinal Block Patient position: sitting Prep: ChloraPrep and site prepped and draped Patient monitoring: heart rate, continuous pulse ox, blood pressure and cardiac monitor Approach: midline Location: L4-5 Injection technique: single-shot Needle Needle type: Introducer and Pencan  Needle gauge: 24 G Needle length: 9 cm Additional Notes Negative paresthesia. Negative blood return. Positive free-flowing CSF. Expiration date of kit checked and confirmed. Patient tolerated procedure well, without complications.

## 2020-05-05 NOTE — Anesthesia Preprocedure Evaluation (Signed)
Anesthesia Evaluation  Patient identified by MRN, date of birth, ID band Patient awake    Reviewed: Allergy & Precautions, H&P , NPO status , Patient's Chart, lab work & pertinent test results, reviewed documented beta blocker date and time   History of Anesthesia Complications (+) PONV and history of anesthetic complications  Airway Mallampati: II   Neck ROM: full    Dental  (+) Teeth Intact   Pulmonary shortness of breath and with exertion, asthma , sleep apnea and Continuous Positive Airway Pressure Ventilation ,    Pulmonary exam normal        Cardiovascular Exercise Tolerance: Poor negative cardio ROS Normal cardiovascular exam Rhythm:regular Rate:Normal     Neuro/Psych  Headaches, PSYCHIATRIC DISORDERS Anxiety Depression  Neuromuscular disease    GI/Hepatic Neg liver ROS, GERD  Medicated,  Endo/Other  negative endocrine ROS  Renal/GU negative Renal ROS  negative genitourinary   Musculoskeletal   Abdominal   Peds  Hematology  (+) Blood dyscrasia, anemia ,   Anesthesia Other Findings Past Medical History: No date: Anemia     Comment:  H/O No date: Anxiety No date: Arthritis No date: Asthma     Comment:  WELL CONTROLLED No date: Complication of anesthesia No date: Depression No date: Dyspnea     Comment:  HAS SEEN DR Gwen Pounds AND HAD STRESS TEST IN MARCH 2017               WITH NORMAL RESULTS No date: GERD (gastroesophageal reflux disease)     Comment:  OCC-TUMS No date: Headache     Comment:  H/O MIGRAINES No date: Neuropathy     Comment:  legs and right side No date: PONV (postoperative nausea and vomiting)     Comment:  distant past No date: PTSD (post-traumatic stress disorder) No date: Sleep apnea     Comment:  USES CPAP Past Surgical History: 1998: BREAST BIOPSY; Left     Comment:  bx/ clip- neg 04/09/2016: CHOLECYSTECTOMY; N/A     Comment:  Procedure: LAPAROSCOPIC CHOLECYSTECTOMY WITH                INTRAOPERATIVE CHOLANGIOGRAM;  Surgeon: Nadeen Landau, MD;  Location: [facility] ORS;  Service: General;                Laterality: N/A; No date: DIAGNOSTIC LAPAROSCOPY No date: DILATION AND CURETTAGE, DIAGNOSTIC / THERAPEUTIC No date: EYE SURGERY; Bilateral     Comment:  cataract 09/10/2019: KNEE ARTHROPLASTY; Right     Comment:  Procedure: COMPUTER ASSISTED TOTAL KNEE ARTHROPLASTY;                Surgeon: Donato Heinz, MD;  Location: [facility] ORS;                Service: Orthopedics;  Laterality: Right; No date: TUBAL LIGATION   Reproductive/Obstetrics negative OB ROS                             Anesthesia Physical Anesthesia Plan  ASA: III  Anesthesia Plan: General and Spinal   Post-op Pain Management:    Induction:   PONV Risk Score and Plan: 4 or greater  Airway Management Planned:   Additional Equipment:   Intra-op Plan:   Post-operative Plan:   Informed Consent: I have reviewed the patients History and Physical, chart, labs and discussed the procedure including  the risks, benefits and alternatives for the proposed anesthesia with the patient or authorized representative who has indicated his/her understanding and acceptance.     Dental Advisory Given  Plan Discussed with: CRNA  Anesthesia Plan Comments:         Anesthesia Quick Evaluation

## 2020-05-05 NOTE — Op Note (Signed)
OPERATIVE NOTE  DATE OF SURGERY:  05/05/2020  PATIENT NAME:  Jacqueline Miller   DOB: January 11, 1946  MRN: [id number]  PRE-OPERATIVE DIAGNOSIS: Degenerative arthrosis of the left knee, primary  POST-OPERATIVE DIAGNOSIS:  Same  PROCEDURE:  Left total knee arthroplasty using computer-assisted navigation  SURGEON:  Jena Gauss. M.D.  ASSISTANT: Baldwin Jamaica, PA-C (present and scrubbed throughout the case, critical for assistance with exposure, retraction, instrumentation, and closure)  ANESTHESIA: spinal  ESTIMATED BLOOD LOSS: 75 mL  FLUIDS REPLACED: 1400 mL of crystalloid  TOURNIQUET TIME: 89 minutes  DRAINS: 2 medium Hemovac drains  SOFT TISSUE RELEASES: Anterior cruciate ligament, posterior cruciate ligament, deep medial collateral ligament, patellofemoral ligament  IMPLANTS UTILIZED: DePuy Attune size 4N posterior stabilized femoral component (cemented), size 3 rotating platform tibial component (cemented), 32 mm medialized dome patella (cemented), and a 5 mm stabilized rotating platform polyethylene insert.  INDICATIONS FOR SURGERY: Jacqueline Miller is a 74 y.o. year old female with a long history of progressive knee pain. X-rays demonstrated severe degenerative changes in tricompartmental fashion. The patient had not seen any significant improvement despite conservative nonsurgical intervention. After discussion of the risks and benefits of surgical intervention, the patient expressed understanding of the risks benefits and agree with plans for total knee arthroplasty.   The risks, benefits, and alternatives were discussed at length including but not limited to the risks of infection, bleeding, nerve injury, stiffness, blood clots, the need for revision surgery, cardiopulmonary complications, among others, and they were willing to proceed.  PROCEDURE IN DETAIL: The patient was brought into the operating room and, after adequate spinal anesthesia was achieved, a tourniquet was placed  on the patient's upper thigh. The patient's knee and leg were cleaned and prepped with alcohol and DuraPrep and draped in the usual sterile fashion. A "timeout" was performed as per usual protocol. The lower extremity was exsanguinated using an Esmarch, and the tourniquet was inflated to 300 mmHg. An anterior longitudinal incision was made followed by a standard mid vastus approach. The deep fibers of the medial collateral ligament were elevated in a subperiosteal fashion off of the medial flare of the tibia so as to maintain a continuous soft tissue sleeve. The patella was subluxed laterally and the patellofemoral ligament was incised. Inspection of the knee demonstrated severe degenerative changes with full-thickness loss of articular cartilage. Osteophytes were debrided using a rongeur. Anterior and posterior cruciate ligaments were excised. Two 4.0 mm Schanz pins were inserted in the femur and into the tibia for attachment of the array of trackers used for computer-assisted navigation. Hip center was identified using a circumduction technique. Distal landmarks were mapped using the computer. The distal femur and proximal tibia were mapped using the computer. The distal femoral cutting guide was positioned using computer-assisted navigation so as to achieve a 5 distal valgus cut. The femur was sized and it was felt that a size 4N femoral component was appropriate. A size 4 femoral cutting guide was positioned and the anterior cut was performed and verified using the computer. This was followed by completion of the posterior and chamfer cuts. Femoral cutting guide for the central box was then positioned in the center box cut was performed.  Attention was then directed to the proximal tibia. Medial and lateral menisci were excised. The extramedullary tibial cutting guide was positioned using computer-assisted navigation so as to achieve a 0 varus-valgus alignment and 3 posterior slope. The cut was performed  and verified using the computer. The proximal tibia was  sized and it was felt that a size 3 tibial tray was appropriate. Tibial and femoral trials were inserted followed by insertion of a 5 mm polyethylene insert. This allowed for excellent mediolateral soft tissue balancing both in flexion and in full extension. Finally, the patella was cut and prepared so as to accommodate a 32 mm medialized dome patella. A patella trial was placed and the knee was placed through a range of motion with excellent patellar tracking appreciated. The femoral trial was removed after debridement of posterior osteophytes. The central post-hole for the tibial component was reamed followed by insertion of a keel punch. Tibial trials were then removed. Cut surfaces of bone were irrigated with copious amounts of normal saline using pulsatile lavage and then suctioned dry. Polymethylmethacrylate cement was prepared in the usual fashion using a vacuum mixer. Cement was applied to the cut surface of the proximal tibia as well as along the undersurface of a size 3 rotating platform tibial component. Tibial component was positioned and impacted into place. Excess cement was removed using Personal assistant. Cement was then applied to the cut surfaces of the femur as well as along the posterior flanges of the size 4N femoral component. The femoral component was positioned and impacted into place. Excess cement was removed using Personal assistant. A 5 mm polyethylene trial was inserted and the knee was brought into full extension with steady axial compression applied. Finally, cement was applied to the backside of a 32 mm medialized dome patella and the patellar component was positioned and patellar clamp applied. Excess cement was removed using Personal assistant. After adequate curing of the cement, the tourniquet was deflated after a total tourniquet time of 89 minutes. Hemostasis was achieved using electrocautery. The knee was irrigated with copious  amounts of normal saline using pulsatile lavage followed by 500 ml of Surgiphor and then suctioned dry. 20 mL of 1.3% Exparel and 60 mL of 0.25% Marcaine in 40 mL of normal saline was injected along the posterior capsule, medial and lateral gutters, and along the arthrotomy site. A 5 mm stabilized rotating platform polyethylene insert was inserted and the knee was placed through a range of motion with excellent mediolateral soft tissue balancing appreciated and excellent patellar tracking noted. 2 medium drains were placed in the wound bed and brought out through separate stab incisions. The medial parapatellar portion of the incision was reapproximated using interrupted sutures of #1 Vicryl. Subcutaneous tissue was approximated in layers using first #0 Vicryl followed #2-0 Vicryl. The skin was approximated with skin staples. A sterile dressing was applied.  The patient tolerated the procedure well and was transported to the recovery room in stable condition.    Brendon Christoffel P. Angie Fava., M.D.

## 2020-05-06 ENCOUNTER — Encounter: Payer: Self-pay | Admitting: Orthopedic Surgery

## 2020-05-06 MED ORDER — SODIUM CHLORIDE 0.9 % IV SOLN
INTRAVENOUS | Status: DC | PRN
  Administered 2020-05-06: 13:00:00 1000 mL via INTRAVENOUS

## 2020-05-06 NOTE — Plan of Care (Signed)

## 2020-05-06 NOTE — Evaluation (Signed)
Physical Therapy Evaluation Patient Details Name: Jacqueline Miller MRN: [id number] DOB: [birth year] Today's Date: 05/06/2020   History of Present Illness  Pt is a 74 yo female s/p L TKA. 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  Clinical Impression  Patient received in recliner. She is ready for PT assessment. Patient performed sit to stand with supervision. Ambulated 100 feet with RW and WBAT L LE, min guard. Good technique with ambulation. Performed LE strengthening/ROM exercises performed at end of session. Patient will continue to benefit from skilled PT while here to improve functional independence, rom and strength in L LE.       Follow Up Recommendations Home health PT    Equipment Recommendations  None recommended by PT    Recommendations for Other Services       Precautions / Restrictions Precautions Precautions: Fall Restrictions Weight Bearing Restrictions: No LLE Weight Bearing: Weight bearing as tolerated      Mobility  Bed Mobility Overal bed mobility: Modified Independent             General bed mobility comments: patient received up in recliner    Transfers Overall transfer level: Needs assistance Equipment used: Rolling walker (2 wheeled) Transfers: Sit to/from Stand Sit to Stand: Supervision            Ambulation/Gait Ambulation/Gait assistance: Min guard Gait Distance (Feet): 100 Feet Assistive device: Rolling walker (2 wheeled) Gait Pattern/deviations: Step-through pattern;Decreased step length - right;Decreased step length - left Gait velocity: slowed   General Gait Details: patient demonstrating good ambulation technique  Stairs            Wheelchair Mobility    Modified Rankin (Stroke Patients Only)       Balance Overall balance assessment: Mild deficits observed, not formally tested                                           Pertinent Vitals/Pain Pain  Assessment: No/denies pain Pain Score: 7  Pain Location: 7/10 with mobility, 0 at rest Pain Descriptors / Indicators: Aching Pain Intervention(s): Monitored during session;Repositioned;Limited activity within patient's tolerance;Premedicated before session;Ice applied    Home Living Family/patient expects to be discharged to:: Private residence Living Arrangements: Spouse/significant other Available Help at Discharge: Family;Available 24 hours/day Type of Home: House Home Access: Level entry;Stairs to enter Entrance Stairs-Rails: Can reach both;Left;Right Entrance Stairs-Number of Steps: level entry from back (main entrance for her), front porch as 5 steps with bilat rails Home Layout: One level Home Equipment: Walker - 4 wheels;Adaptive equipment;Walker - 2 wheels;Bedside commode      Prior Function Level of Independence: Independent         Comments: Since recovering from previous R TKA ~86mo ago, pt has been independent. 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     Hand Dominance   Dominant Hand: Right    Extremity/Trunk Assessment   Upper Extremity Assessment Upper Extremity Assessment: Overall WFL for tasks assessed    Lower Extremity Assessment Lower Extremity Assessment: LLE deficits/detail LLE Deficits / Details: s/p L TKA LLE Coordination: decreased gross motor    Cervical / Trunk Assessment Cervical / Trunk Assessment: Normal  Communication   Communication: No difficulties  Cognition Arousal/Alertness: Awake/alert Behavior During Therapy: WFL for tasks assessed/performed Overall Cognitive Status: Within Functional Limits for tasks assessed  General Comments      Exercises Total Joint Exercises Ankle Circles/Pumps: AROM;10 reps;Both Quad Sets: AROM;Left;10 reps Hip ABduction/ADduction: AROM;Left;10 reps Straight Leg Raises: AROM;10 reps Long Arc Quad:  AROM;10 reps Knee Flexion: AROM;10 reps Goniometric ROM: 3-90 AAROM Other Exercises Other Exercises: Pt instructed in falls prevention, home/routines modifications, AE/DME for ADL, self care skills, pet care considerations, polar care mgt, and compression stocking mgt. Handout provided.   Assessment/Plan    PT Assessment Patient needs continued PT services  PT Problem List Decreased strength;Decreased mobility;Decreased range of motion;Decreased activity tolerance;Decreased balance;Pain       PT Treatment Interventions Therapeutic activities;Gait training;Therapeutic exercise;Stair training;Functional mobility training;Patient/family education    PT Goals (Current goals can be found in the Care Plan section)  Acute Rehab PT Goals Patient Stated Goal: go home and get back to volunteering PT Goal Formulation: With patient Time For Goal Achievement: 05/13/20 Potential to Achieve Goals: Good    Frequency BID   Barriers to discharge        Co-evaluation               AM-PAC PT "6 Clicks" Mobility  Outcome Measure Help needed turning from your back to your side while in a flat bed without using bedrails?: A Little Help needed moving from lying on your back to sitting on the side of a flat bed without using bedrails?: A Little Help needed moving to and from a bed to a chair (including a wheelchair)?: A Little Help needed standing up from a chair using your arms (e.g., wheelchair or bedside chair)?: None Help needed to walk in hospital room?: A Little Help needed climbing 3-5 steps with a railing? : A Little 6 Click Score: 19    End of Session Equipment Utilized During Treatment: Gait belt Activity Tolerance: Patient tolerated treatment well Patient left: in chair;with call bell/phone within reach;with chair alarm set Nurse Communication: Mobility status PT Visit Diagnosis: Muscle weakness (generalized) (M62.81);Other abnormalities of gait and mobility (R26.89);Difficulty  in walking, not elsewhere classified (R26.2);Pain Pain - Right/Left: Left Pain - part of body: Knee    Time: 0930-0950 PT Time Calculation (min) (ACUTE ONLY): 20 min   Charges:   PT Evaluation $PT Eval Moderate Complexity: 1 Mod PT Treatments $Gait Training: 8-22 mins        [employer], PT, GCS 05/06/20,10:02 AM

## 2020-05-06 NOTE — Anesthesia Postprocedure Evaluation (Signed)
Anesthesia Post Note  Patient: Jacqueline Miller  Procedure(s) Performed: COMPUTER ASSISTED TOTAL KNEE ARTHROPLASTY (Left Knee)  Patient location during evaluation: Nursing Unit Anesthesia Type: Spinal Level of consciousness: oriented and awake and alert Pain management: pain level controlled Vital Signs Assessment: post-procedure vital signs reviewed and stable Respiratory status: spontaneous breathing, respiratory function stable and patient connected to nasal cannula oxygen Cardiovascular status: blood pressure returned to baseline and stable Postop Assessment: no headache, no backache and no apparent nausea or vomiting Anesthetic complications: no   No complications documented.   Last Vitals:  Vitals:   05/06/20 0431 05/06/20 0828  BP: (!) 136/58 (!) 115/51  Pulse: 86 76  Resp: 17 16  Temp: 36.5 C 37 C  SpO2: 97% 93%    Last Pain:  Vitals:   05/06/20 0828  TempSrc: Oral  PainSc:                  Rica Mast

## 2020-05-06 NOTE — Evaluation (Signed)
Occupational Therapy Evaluation Patient Details Name: Jacqueline Miller MRN: [id number] DOB: 1945-09-04 Today's Date: 05/06/2020    History of Present Illness Pt is a 74 yo female s/p L TKA. 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   Clinical Impression   Pt seen for OT evaluation this date, POD#1 from above surgery. 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 Pt is eager to return to PLOF with less pain and improved safety and independence. Pt was able to complete supine>sit EOB with supervision and able to initiate LB dressing over her feet in sitting after set up and required CGA for completing LB dressing over hips while due to L knee pain in standing and limited AROM of L knee. CGA for transition from bed to recliner. Pt denied pain at rest. Pt instructed in polar care mgt, falls prevention strategies, home/routines modifications, DME/AE for LB bathing and dressing tasks, pet care considerations, and compression stocking mgt. Handout provided. Pt verbalized understanding and denied additional concerns or needs. Do not currently anticipate any additional skilled OT needs at this time. Will sign off.      Follow Up Recommendations  No OT follow up    Equipment Recommendations  None recommended by OT    Recommendations for Other Services       Precautions / Restrictions Precautions Precautions: Fall Restrictions Weight Bearing Restrictions: Yes LLE Weight Bearing: Weight bearing as tolerated      Mobility Bed Mobility Overal bed mobility: Modified Independent             General bed mobility comments: increased time/effort but no physical assist    Transfers Overall transfer level: Needs assistance Equipment used: Rolling walker (2 wheeled) Transfers: Sit to/from Stand Sit to Stand: Min guard              Balance Overall balance  assessment: Mild deficits observed, not formally tested                                         ADL either performed or assessed with clinical judgement   ADL Overall ADL's : Needs assistance/impaired                                       General ADL Comments: Pt able to don undergarments seated EOB with set up and supervision, CGA to stand and complete donning over hips with no UE support on the walker.  CGA for toilet transfers. Will require assist for compression stocking mgt and polar care mgt.     Vision Baseline Vision/History: Wears glasses Wears Glasses: Distance only (doesn't have them yet but getting soon; recent cataracts surgery) Patient Visual Report: No change from baseline       Perception     Praxis      Pertinent Vitals/Pain Pain Assessment: 0-10 Pain Score: 7  Pain Location: 7/10 with mobility, 0 at rest Pain Descriptors / Indicators: Aching Pain Intervention(s): Limited activity within patient's tolerance;Monitored during session;Premedicated before session;Repositioned;Ice applied     Hand Dominance Right   Extremity/Trunk Assessment Upper Extremity Assessment Upper Extremity Assessment: Overall WFL for tasks assessed   Lower Extremity Assessment Lower Extremity Assessment: LLE deficits/detail LLE  Deficits / Details: s/p L TKA       Communication Communication Communication: No difficulties   Cognition Arousal/Alertness: Awake/alert Behavior During Therapy: WFL for tasks assessed/performed Overall Cognitive Status: Within Functional Limits for tasks assessed                                     General Comments       Exercises Other Exercises Other Exercises: Pt instructed in falls prevention, home/routines modifications, AE/DME for ADL, self care skills, pet care considerations, polar care mgt, and compression stocking mgt. Handout provided.   Shoulder Instructions      Home Living  Family/patient expects to be discharged to:: Private residence Living Arrangements: Spouse/significant other Available Help at Discharge: Family;Available 24 hours/day Type of Home: House Home Access: Level entry;Stairs to enter Entrance Stairs-Number of Steps: level entry from back (main entrance for her), front porch as 5 steps with bilat rails Entrance Stairs-Rails: Can reach both;Left;Right Home Layout: One level     Bathroom Shower/Tub: Tub/shower unit;Walk-in shower   Bathroom Toilet: Standard     Home Equipment: Environmental consultant - 4 wheels;Adaptive equipment;Walker - 2 wheels;Bedside commode Adaptive Equipment: Reacher        Prior Functioning/Environment Level of Independence: Independent        Comments: Since recovering from previous R TKA ~71mo ago, pt has been independent. 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        OT Problem List: Decreased strength;Pain;Decreased range of motion;Impaired balance (sitting and/or standing)      OT Treatment/Interventions:      OT Goals(Current goals can be found in the care plan section) Acute Rehab OT Goals Patient Stated Goal: go home and get back to volunteering OT Goal Formulation: All assessment and education complete, DC therapy  OT Frequency:     Barriers to D/C:            Co-evaluation              AM-PAC OT "6 Clicks" Daily Activity     Outcome Measure Help from another person eating meals?: None Help from another person taking care of personal grooming?: None Help from another person toileting, which includes using toliet, bedpan, or urinal?: None Help from another person bathing (including washing, rinsing, drying)?: A Little Help from another person to put on and taking off regular upper body clothing?: None Help from another person to put on and taking off regular lower body clothing?: A Little 6 Click Score: 22   End of Session Equipment Utilized During Treatment:  Gait belt;Rolling walker  Activity Tolerance: Patient tolerated treatment well Patient left: in chair;with call bell/phone within reach;with chair alarm set;Other (comment) (polar care in place)  OT Visit Diagnosis: Other abnormalities of gait and mobility (R26.89);Pain Pain - Right/Left: Left Pain - part of body: Knee                Time: 0092-3300 OT Time Calculation (min): 31 min Charges:  OT General Charges $OT Visit: 1 Visit OT Evaluation $OT Eval Low Complexity: 1 Low OT Treatments $Self Care/Home Management : 23-37 mins  Richrd Prime, MPH, MS, OTR/L ascom [phone] 05/06/20, 9:47 AM

## 2020-05-06 NOTE — Progress Notes (Signed)
ORTHOPAEDICS PROGRESS NOTE  PATIENT NAME: Jacqueline Miller DOB: [birth year]  MRN: [id number]  POD # 1: Left total knee arthroplasty  Subjective: The patient rested well last night.  Pain is been under good control.  She denies any nausea or vomiting.  Objective: Vital signs in last 24 hours: Temp:  [97.7 F (36.5 C)-98.6 F (37 C)] 97.7 F (36.5 C) (12/14 0431) Pulse Rate:  [79-92] 86 (12/14 0431) Resp:  [13-24] 17 (12/14 0431) BP: (110-141)/(58-82) 136/58 (12/14 0431) SpO2:  [92 %-98 %] 97 % (12/14 0431) Weight:  [92.4 kg] 92.4 kg (12/13 1029)  Intake/Output from previous day: 12/13 0701 - 12/14 0700 In: 2165.3 [I.V.:1367.9; IV Piggyback:797.4] Out: 725 [Urine:600; Drains:50; Blood:75]  No results for input(s): WBC, HGB, HCT, PLT, K, CL, CO2, BUN, CREATININE, GLUCOSE, CALCIUM, LABPT, INR in the last 72 hours.  EXAM General: Well-developed well-nourished female seen in no apparent discomfort. Lungs: clear to auscultation Cardiac: normal rate and regular rhythm Abdomen: Soft, nontender, nondistended.  Bowel sounds are present. Left lower extremity: Knee dressing is dry and intact.  Polar Care and Hemovac drains are intact and functioning.  The patient is able to perform an independent straight leg raise.  Homans test is negative. Neurologic: Awake, alert, and oriented.  Sensory and motor function are intact.  Assessment: Left total knee arthroplasty  Secondary diagnoses: Sleep apnea Posttraumatic stress disorder Neuropathy Gastroesophageal reflux disease Depression Asthma Anxiety  Plan: Today's goals were reviewed with the patient.  Begin physical therapy and Occupational Therapy as per total knee arthroplasty rehab protocol. Plan is to go Home after hospital stay. DVT Prophylaxis - Lovenox, Foot Pumps and TED hose  Klint Lezcano P. Angie Fava M.D.

## 2020-05-06 NOTE — Progress Notes (Signed)
Physical Therapy Treatment Patient Details Name: Jacqueline Miller MRN: [id number] DOB: [birth year] Today's Date: 05/06/2020    History of Present Illness Pt is a 74 yo female s/p L TKA. 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    PT Comments    Patient received in recliner, finished eating lunch, feeling good. Agrees to PT session. Patient requires cues and supervision for sit to stand. Ambulated 200 feet with RW and min guard/supervision. Good technique with ambulation. Reciprocal pattern. Patient progressing very well toward goals. She will continue to benefit from skilled PT to improve independence, strength and ROM.     Follow Up Recommendations  Home health PT     Equipment Recommendations  None recommended by PT    Recommendations for Other Services       Precautions / Restrictions Precautions Precautions: Fall Restrictions Weight Bearing Restrictions: No LLE Weight Bearing: Weight bearing as tolerated    Mobility  Bed Mobility Overal bed mobility: Modified Independent             General bed mobility comments: patient able to go from sitting to supine mod independently.  Transfers Overall transfer level: Needs assistance Equipment used: Rolling walker (2 wheeled) Transfers: Sit to/from Stand Sit to Stand: Min guard         General transfer comment: cues for hand placement during transfers  Ambulation/Gait Ambulation/Gait assistance: Min guard;Supervision Gait Distance (Feet): 200 Feet Assistive device: Rolling walker (2 wheeled) Gait Pattern/deviations: Step-through pattern;Decreased step length - right;Decreased step length - left Gait velocity: slowed   General Gait Details: patient demonstrating good ambulation technique   Stairs             Wheelchair Mobility    Modified Rankin (Stroke Patients Only)       Balance Overall balance assessment: Modified Independent                                           Cognition Arousal/Alertness: Awake/alert Behavior During Therapy: WFL for tasks assessed/performed Overall Cognitive Status: Within Functional Limits for tasks assessed                                        Exercises Total Joint Exercises Ankle Circles/Pumps: AROM;10 reps;Both Quad Sets: AROM;Left;10 reps Heel Slides: AROM;Left;10 reps Hip ABduction/ADduction: AROM;10 reps;Left Straight Leg Raises: AROM;Left;10 reps Long Arc Quad: AROM;10 reps Knee Flexion: AROM;10 reps Goniometric ROM: 3-90 AAROM Other Exercises Other Exercises: Pt instructed in falls prevention, home/routines modifications, AE/DME for ADL, self care skills, pet care considerations, polar care mgt, and compression stocking mgt. Handout provided.    General Comments        Pertinent Vitals/Pain Pain Assessment: Faces Pain Score: 7  Faces Pain Scale: Hurts little more Pain Location: 7/10 with mobility, 0 at rest Pain Descriptors / Indicators: Aching;Discomfort;Guarding;Grimacing;Sore Pain Intervention(s): Limited activity within patient's tolerance;Monitored during session;Repositioned;Premedicated before session    Home Living Family/patient expects to be discharged to:: Private residence Living Arrangements: Spouse/significant other Available Help at Discharge: Family;Available 24 hours/day Type of Home: House Home Access: Level entry;Stairs to enter Entrance Stairs-Rails: Can reach both;Left;Right Home Layout: One level Home Equipment: Walker - 4 wheels;Adaptive equipment;Walker - 2 wheels;Bedside commode      Prior Function Level of Independence: Independent  Comments: Since recovering from previous R TKA ~79mo ago, pt has been independent. 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   PT Goals (current goals can now be found in the care plan section) Acute Rehab PT Goals Patient Stated Goal: go home and get  back to volunteering PT Goal Formulation: With patient Time For Goal Achievement: 05/13/20 Potential to Achieve Goals: Good Progress towards PT goals: Progressing toward goals    Frequency    BID      PT Plan Current plan remains appropriate    Co-evaluation              AM-PAC PT "6 Clicks" Mobility   Outcome Measure  Help needed turning from your back to your side while in a flat bed without using bedrails?: None Help needed moving from lying on your back to sitting on the side of a flat bed without using bedrails?: None Help needed moving to and from a bed to a chair (including a wheelchair)?: A Little Help needed standing up from a chair using your arms (e.g., wheelchair or bedside chair)?: A Little Help needed to walk in hospital room?: A Little Help needed climbing 3-5 steps with a railing? : A Little 6 Click Score: 20    End of Session Equipment Utilized During Treatment: Gait belt Activity Tolerance: Patient tolerated treatment well Patient left: in bed;with call bell/phone within reach;with bed alarm set Nurse Communication: Mobility status PT Visit Diagnosis: Muscle weakness (generalized) (M62.81);Other abnormalities of gait and mobility (R26.89);Difficulty in walking, not elsewhere classified (R26.2);Pain Pain - Right/Left: Left Pain - part of body: Knee     Time: 1230-1255 PT Time Calculation (min) (ACUTE ONLY): 25 min  Charges:  $Gait Training: 8-22 mins $Therapeutic Exercise: 8-22 mins                     [employer], PT, GCS 05/06/20,1:09 PM

## 2020-05-07 MED ORDER — CELECOXIB 200 MG PO CAPS: 200.0000 mg | ORAL_CAPSULE | Freq: Two times a day (BID) | ORAL | 0 refills | Status: AC

## 2020-05-07 MED ORDER — OXYCODONE HCL 5 MG PO TABS
5.0000 mg | ORAL_TABLET | ORAL | 0 refills | Status: DC | PRN
Start: 2020-05-07 — End: 2022-07-19

## 2020-05-07 MED ORDER — ENOXAPARIN SODIUM 40 MG/0.4ML ~~LOC~~ SOLN: 40.0000 mg | SUBCUTANEOUS | 0 refills | Status: DC

## 2020-05-07 MED ORDER — TRAMADOL HCL 50 MG PO TABS
50.0000 mg | ORAL_TABLET | ORAL | 0 refills | Status: DC | PRN
Start: 2020-05-07 — End: 2022-07-19

## 2020-05-07 NOTE — Progress Notes (Signed)
Discharge Note:  Pt given discharge instructions, pt verbalized understanding. extra honeycome dressings. TED hose on both legs, pt wheeled out by staff, husband providing transportation.

## 2020-05-07 NOTE — Progress Notes (Signed)
  Subjective: 2 Days Post-Op Procedure(s) (LRB): COMPUTER ASSISTED TOTAL KNEE ARTHROPLASTY (Left) Patient reports pain as well-controlled.   Patient is well, and has had no acute complaints or problems Plan is to go Home after hospital stay. Negative for chest pain and shortness of breath Fever: no Gastrointestinal: negative for nausea and vomiting.   Patient has had a bowel movement.  Objective: Vital signs in last 24 hours: Temp:  [97.7 F (36.5 C)-98.6 F (37 C)] 97.9 F (36.6 C) (12/15 0508) Pulse Rate:  [76-90] 81 (12/15 0508) Resp:  [15-18] 15 (12/15 0508) BP: (115-148)/(49-77) 148/76 (12/15 0508) SpO2:  [93 %-95 %] 94 % (12/15 0508)  Intake/Output from previous day:  Intake/Output Summary (Last 24 hours) at 05/07/2020 0753 Last data filed at 05/07/2020 0519 Gross per 24 hour  Intake 608.65 ml  Output 600 ml  Net 8.65 ml    Intake/Output this shift: No intake/output data recorded.  Labs: No results for input(s): HGB in the last 72 hours. No results for input(s): WBC, RBC, HCT, PLT in the last 72 hours. No results for input(s): NA, K, CL, CO2, BUN, CREATININE, GLUCOSE, CALCIUM in the last 72 hours. No results for input(s): LABPT, INR in the last 72 hours.   EXAM General - Patient is Alert, Appropriate and Oriented Extremity - Neurovascular intact Dorsiflexion/Plantar flexion intact Compartment soft Dressing/Incision -clean, dry, no drainage, Postoperative dressing remains in place., Polar Care in place and working. Hemovac in place Motor Function - intact, moving foot and toes well on exam.  Cardiovascular- Regular rate and rhythm, no murmurs/rubs/gallops Respiratory- Lungs clear to auscultation bilaterally Gastrointestinal- soft and nontender   Assessment/Plan: 2 Days Post-Op Procedure(s) (LRB): COMPUTER ASSISTED TOTAL KNEE ARTHROPLASTY (Left) Active Problems:   Total knee replacement status  Estimated body mass index is 38.49 kg/m as calculated from  the following:   Height as of this encounter: 5\' 1"  (1.549 m).   Weight as of this encounter: 92.4 kg. Advance diet Up with therapy Discharge home with home health  Hemovac removed. Compression dressing applied. Fresh honeycomb applied   DVT Prophylaxis - Lovenox, Ted hose and foot pumps Weight-Bearing as tolerated to left leg  , PA-C [facility] Orthopaedic Surgery 05/07/2020, 7:53 AM

## 2020-05-07 NOTE — Discharge Summary (Signed)
Physician Discharge Summary  Patient ID: Jacqueline Miller MRN: [id number] DOB/AGE: July 14, 1945 74 y.o.  Admit date: 05/05/2020 Discharge date: 05/07/2020  Admission Diagnoses:  Total knee replacement status [Z96.659]  Surgeries:Procedure(s): Left total knee arthroplasty using computer-assisted navigation  SURGEON:  Jena Gauss. M.D.  ASSISTANT: [place], PA-C (present and scrubbed throughout the case, critical for assistance with exposure, retraction, instrumentation, and closure)  ANESTHESIA: spinal  ESTIMATED BLOOD LOSS: 75 mL  FLUIDS REPLACED: 1400 mL of crystalloid  TOURNIQUET TIME: 89 minutes  DRAINS: 2 medium Hemovac drains  SOFT TISSUE RELEASES: Anterior cruciate ligament, posterior cruciate ligament, deep medial collateral ligament, patellofemoral ligament  IMPLANTS UTILIZED: DePuy Attune size 4N posterior stabilized femoral component (cemented), size 3 rotating platform tibial component (cemented), 32 mm medialized dome patella (cemented), and a 5 mm stabilized rotating platform polyethylene insert.  Discharge Diagnoses: Patient Active Problem List   Diagnosis Date Noted  . Total knee replacement status 05/05/2020  . Status post total right knee replacement 09/10/2019  . Headache 05/06/2019  . Meralgia paresthetica 05/06/2019  . Allergic conjunctivitis and rhinitis, bilateral 06/01/2016  . Glucose intolerance (impaired glucose tolerance) 06/01/2016  . Obesity, Class II, BMI 35-39.9 06/01/2016  . OSA on CPAP 06/01/2016  . Shortness of breath 07/14/2015  . Asthma 09/10/2013  . Atrophic vaginitis 09/10/2013  . Vitamin D deficiency 09/10/2013  . PTSD (post-traumatic stress disorder) 07/18/2013    Past Medical History:  Diagnosis Date  . Anemia    H/O  . Anxiety   . Arthritis   . Asthma    WELL CONTROLLED  . Complication of anesthesia   . Depression   . Dyspnea    HAS SEEN DR Gwen Pounds AND HAD STRESS TEST IN MARCH 2017 WITH NORMAL RESULTS   . GERD (gastroesophageal reflux disease)    OCC-TUMS  . Headache    H/O MIGRAINES  . Neuropathy    legs and right side  . PONV (postoperative nausea and vomiting)    distant past  . PTSD (post-traumatic stress disorder)   . Sleep apnea    USES CPAP     Transfusion:    Consultants (if any):   Discharged Condition: Improved  Hospital Course: Jacqueline Miller is an 74 y.o. female who was admitted 05/05/2020 with a diagnosis of left knee osteoarthritis and went to the operating room on 05/05/2020 and underwent left total knee arthroplasty. The patient received perioperative antibiotics for prophylaxis (see below). The patient tolerated the procedure well and was transported to PACU in stable condition. After meeting PACU criteria, the patient was subsequently transferred to the Orthopaedics/Rehabilitation unit.   The patient received DVT prophylaxis in the form of early mobilization, Lovenox. A sacral pad had been placed and heels were elevated off of the bed with rolled towels in order to protect skin integrity. Foley catheter was discontinued on postoperative day #0. Wound drains were discontinued on postoperative day #2. The surgical incision was healing well without signs of infection.  Physical therapy was initiated postoperatively for transfers, gait training, and strengthening. Occupational therapy was initiated for activities of daily living and evaluation for assisted devices. Rehabilitation goals were reviewed in detail with the patient. The patient made steady progress with physical therapy and physical therapy recommended discharge to Home.   The patient achieved the preliminary goals of this hospitalization and was felt to be medically and orthopaedically appropriate for discharge.  She was given perioperative antibiotics:  Anti-infectives (From admission, onward)   Start  Dose/Rate Route Frequency Ordered Stop   05/05/20 1715  ceFAZolin (ANCEF) IVPB 2g/100 mL premix         2 g 200 mL/hr over 30 Minutes Intravenous Every 6 hours 05/05/20 1619 05/05/20 2330   05/05/20 1000  ceFAZolin (ANCEF) IVPB 2g/100 mL premix        2 g 200 mL/hr over 30 Minutes Intravenous On call to O.R. 05/05/20 3295 05/05/20 1210    .  Recent vital signs:  Vitals:   05/07/20 0508 05/07/20 0757  BP: (!) 148/76 138/72  Pulse: 81 72  Resp: 15 17  Temp: 97.9 F (36.6 C) 98.2 F (36.8 C)  SpO2: 94% 95%    Recent laboratory studies:  No results for input(s): WBC, HGB, HCT, PLT, K, CL, CO2, BUN, CREATININE, GLUCOSE, CALCIUM, LABPT, INR in the last 72 hours.  Diagnostic Studies: DG Knee Left Port  Result Date: 05/05/2020 CLINICAL DATA:  Status post left total knee replacement. EXAM: PORTABLE LEFT KNEE - 1-2 VIEW COMPARISON:  None. FINDINGS: The left femoral and tibial components are well situated. Surgical drain is seen in the soft tissues anterior to the distal femur. Other expected postoperative changes are noted in the soft tissues anteriorly. IMPRESSION: Status post left total knee arthroplasty. Electronically Signed   By: Lupita Raider M.D.   On: 05/05/2020 15:36    Discharge Medications:   Allergies as of 05/07/2020      Reactions   Other Nausea And Vomiting   TREE NUTS   Tetracycline Other (See Comments)   headaches      Medication List    TAKE these medications   albuterol 108 (90 Base) MCG/ACT inhaler Commonly known as: VENTOLIN HFA Inhale 1 puff into the lungs every 6 (six) hours as needed for wheezing or shortness of breath.   Calcipotriene 0.005 % solution Apply 1 application topically every 30 (thirty) days.   celecoxib 200 MG capsule Commonly known as: CELEBREX Take 1 capsule (200 mg total) by mouth 2 (two) times daily.   Clobetasol Propionate 0.05 % shampoo Apply 1 application topically every 30 (thirty) days.   diphenhydrAMINE 25 MG tablet Commonly known as: BENADRYL Take 25 mg by mouth 3 (three) times daily as needed for allergies.    enoxaparin 40 MG/0.4ML injection Commonly known as: LOVENOX Inject 0.4 mLs (40 mg total) into the skin daily for 14 days.   Enstilar 0.005-0.064 % Foam Generic drug: Calcipotriene-Betameth Diprop Apply 1 application topically daily as needed (psoriasis under arms).   escitalopram 20 MG tablet Commonly known as: LEXAPRO Take 20 mg by mouth at bedtime.   Fluticasone-Salmeterol 250-50 MCG/DOSE Aepb Commonly known as: ADVAIR Inhale 1 puff into the lungs 2 (two) times daily as needed (shortness of breath).   oxyCODONE 5 MG immediate release tablet Commonly known as: Oxy IR/ROXICODONE Take 1 tablet (5 mg total) by mouth every 4 (four) hours as needed for moderate pain (pain score 4-6).   PRESCRIPTION MEDICATION Place 4 drops into the right eye in the morning, at noon, in the evening, and at bedtime. 3 in one eye drop   traMADol 50 MG tablet Commonly known as: ULTRAM Take 1 tablet (50 mg total) by mouth every 4 (four) hours as needed for moderate pain.   Vitamin D (Ergocalciferol) 1.25 MG (50000 UNIT) Caps capsule Commonly known as: DRISDOL Take 50,000 Units by mouth every 7 (seven) days.            Durable Medical Equipment  (From admission, onward)  Start     Ordered   05/05/20 1620  DME Walker rolling  Once       Question:  Patient needs a walker to treat with the following condition  Answer:  Total knee replacement status   05/05/20 1619   05/05/20 1620  DME Bedside commode  Once       Question:  Patient needs a bedside commode to treat with the following condition  Answer:  Total knee replacement status   05/05/20 1619          Disposition: home with home health PT      Follow-up Information    [facility] On 05/20/2020.   Specialty: Orthopedic Surgery Why: at 1:45pm Contact information: [address] [facility] [place] [address] [phone]        Donato Heinz, MD On  06/17/2020.   Specialty: Orthopedic Surgery Why: at 2:00pm Contact information: [address] [facility] [place] [address] [phone]                Lasandra Beech, PA-C 05/07/2020, 7:59 AM

## 2020-05-07 NOTE — Progress Notes (Signed)
Physical Therapy Treatment Patient Details Name: Jacqueline Miller MRN: [id number] DOB: 11-May-1946 Today's Date: 05/07/2020    History of Present Illness Pt is a 74 yo female s/p L TKA. 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    PT Comments    Patient received in bed, reports 0 pain at rest. Ready to go home today. Patient reports increased pain with mobility this day. Bed mobility performed independently. Transfers and ambulates with supervision. Performed stair training with supervision, cues for sequencing. Patient making good progress toward goals. Ready to return home.     Follow Up Recommendations  Home health PT     Equipment Recommendations  None recommended by PT    Recommendations for Other Services       Precautions / Restrictions Precautions Precautions: Fall Restrictions Weight Bearing Restrictions: No LLE Weight Bearing: Weight bearing as tolerated    Mobility  Bed Mobility Overal bed mobility: Independent                Transfers Overall transfer level: Modified independent Equipment used: Rolling walker (2 wheeled) Transfers: Sit to/from Stand Sit to Stand: Supervision         General transfer comment: cues for hand placement during transfers  Ambulation/Gait Ambulation/Gait assistance: Supervision Gait Distance (Feet): 125 Feet Assistive device: Rolling walker (2 wheeled) Gait Pattern/deviations: Step-through pattern;Decreased step length - right;Decreased step length - left;Decreased weight shift to left Gait velocity: slowed   General Gait Details: patient demonstrating good ambulation technique   Stairs Stairs: Yes Stairs assistance: Supervision Stair Management: Two rails;Step to pattern Number of Stairs: 4 General stair comments: safe with steps, B rails, cues for sequencing   Wheelchair Mobility    Modified Rankin (Stroke Patients Only)       Balance Overall balance assessment:  Modified Independent                                          Cognition Arousal/Alertness: Awake/alert Behavior During Therapy: WFL for tasks assessed/performed Overall Cognitive Status: Within Functional Limits for tasks assessed                                        Exercises Total Joint Exercises Ankle Circles/Pumps: AROM;10 reps;Both Quad Sets: AROM;Left;10 reps Heel Slides: AROM;Left;10 reps Hip ABduction/ADduction: AROM;Left;10 reps Straight Leg Raises: AROM;Left;10 reps Long Arc Quad: AROM;Left;10 reps Goniometric ROM: 0-90 AAROM    General Comments        Pertinent Vitals/Pain Pain Assessment: 0-10 Pain Score: 8  Pain Location: 8/10 with mobility, 0 at rest Pain Descriptors / Indicators: Aching;Sore;Discomfort;Grimacing;Guarding Pain Intervention(s): Limited activity within patient's tolerance;Monitored during session;Repositioned;Ice applied    Home Living                      Prior Function            PT Goals (current goals can now be found in the care plan section) Acute Rehab PT Goals Patient Stated Goal: go home and get back to volunteering PT Goal Formulation: With patient Time For Goal Achievement: 05/13/20 Potential to Achieve Goals: Good Progress towards PT goals: Progressing toward goals    Frequency    BID      PT Plan Current plan remains appropriate  Co-evaluation              AM-PAC PT "6 Clicks" Mobility   Outcome Measure  Help needed turning from your back to your side while in a flat bed without using bedrails?: None Help needed moving from lying on your back to sitting on the side of a flat bed without using bedrails?: None Help needed moving to and from a bed to a chair (including a wheelchair)?: None Help needed standing up from a chair using your arms (e.g., wheelchair or bedside chair)?: A Little Help needed to walk in hospital room?: None Help needed climbing 3-5 steps  with a railing? : A Little 6 Click Score: 22    End of Session Equipment Utilized During Treatment: Gait belt Activity Tolerance: Patient tolerated treatment well Patient left: with call bell/phone within reach;in chair;with chair alarm set Nurse Communication: Mobility status PT Visit Diagnosis: Muscle weakness (generalized) (M62.81);Other abnormalities of gait and mobility (R26.89);Difficulty in walking, not elsewhere classified (R26.2);Pain Pain - Right/Left: Left Pain - part of body: Knee     Time: 0930-0950 PT Time Calculation (min) (ACUTE ONLY): 20 min  Charges:  $Gait Training: 8-22 mins                     Shanteria Laye, PT, GCS 05/07/20,9:57 AM

## 2020-05-07 NOTE — TOC Initial Note (Signed)
Transition of Care [facility]) - Initial/Assessment Note    Patient Details  Name: Jacqueline Miller MRN: [id number] Date of Birth: 08-27-1945  Transition of Care [facility]) CM/SW Contact:    Shelbie Ammons, RN Phone Number: 05/07/2020, 8:16 AM  Clinical Narrative:    RNCM met with patient at bedside to discuss discharge planning. Patient is from home, normally independent with ADLs and is status post left knee replacement. Patient sitting to EOB eating breakfast and reports to feeling well this morning. Patient reports that she has already been contacted by [facility] and they are going to come out when she gets home. She reports that she already has all necessary equipment in the home.              Expected Discharge Plan: [place] Barriers to Discharge: No Barriers Identified   Patient Goals and CMS Choice        Expected Discharge Plan and Services Expected Discharge Plan: [place] Choice: [place] arrangements for the past 2 months: Single Family Home Expected Discharge Date: 05/07/20                         HH Arranged: PT,OT [place] Agency: [facility] (formerly [employer]) Date [place]: 05/07/20 Time [place]: [phone] Representative spoke with at [place]: [place] Arrangements/Services Living arrangements for the past 2 months: [place] giver support system in place?: Yes (comment)      Activities of Daily Living Home Assistive Devices/Equipment: Environmental consultant (specify type),Raised toilet seat with rails ADL Screening (condition at time of admission) Patient's cognitive ability adequate to safely complete daily activities?: Yes Is the patient deaf or have difficulty hearing?: No Does the patient have difficulty seeing, even when wearing glasses/contacts?: No Does the patient have difficulty concentrating, remembering, or making decisions?:  No Patient able to express need for assistance with ADLs?: Yes Does the patient have difficulty dressing or bathing?: No Independently performs ADLs?: Yes (appropriate for developmental age) Does the patient have difficulty walking or climbing stairs?: Yes Weakness of Legs: Left Weakness of Arms/Hands: None  Permission Sought/Granted         Permission granted to share info w AGENCY: [facility]        Emotional Assessment Appearance:: Appears stated age Attitude/Demeanor/Rapport: Engaged Affect (typically observed): Calm,Appropriate Orientation: : Oriented to Place,Oriented to Self,Oriented to  Time,Oriented to Situation Alcohol / Substance Use: Not Applicable Psych Involvement: No (comment)  Admission diagnosis:  Total knee replacement status [Z96.659] Patient Active Problem List   Diagnosis Date Noted  . Total knee replacement status 05/05/2020  . Status post total right knee replacement 09/10/2019  . Headache 05/06/2019  . Meralgia paresthetica 05/06/2019  . Allergic conjunctivitis and rhinitis, bilateral 06/01/2016  . Glucose intolerance (impaired glucose tolerance) 06/01/2016  . Obesity, Class II, BMI 35-39.9 06/01/2016  . OSA on CPAP 06/01/2016  . Shortness of breath 07/14/2015  . Asthma 09/10/2013  . Atrophic vaginitis 09/10/2013  . Vitamin D deficiency 09/10/2013  . PTSD (post-traumatic stress disorder) 07/18/2013   PCP:  [facility]:   [place], [place] [address] Phone: [phone] Fax: [place], [place] -  [place] [address] Phone: [phone] Fax: [phone]     Social Determinants of Health (SDOH) Interventions    Readmission Risk Interventions No flowsheet data found.

## 2020-05-30 ENCOUNTER — Other Ambulatory Visit: Payer: Self-pay | Admitting: Family Medicine

## 2020-05-30 DIAGNOSIS — Z780 Asymptomatic menopausal state: Secondary | ICD-10-CM

## 2020-05-30 DIAGNOSIS — Z1231 Encounter for screening mammogram for malignant neoplasm of breast: Secondary | ICD-10-CM

## 2020-07-29 ENCOUNTER — Ambulatory Visit
Admission: RE | Admit: 2020-07-29 | Discharge: 2020-07-29 | Disposition: A | Discharge status: Home / Self Care | Payer: Medicare Other | Source: Ambulatory Visit | Attending: Family Medicine | Admitting: Family Medicine

## 2020-07-29 ENCOUNTER — Other Ambulatory Visit: Payer: Self-pay

## 2020-07-29 DIAGNOSIS — Z1231 Encounter for screening mammogram for malignant neoplasm of breast: Secondary | ICD-10-CM | POA: Insufficient documentation

## 2020-08-11 ENCOUNTER — Ambulatory Visit (INDEPENDENT_AMBULATORY_CARE_PROVIDER_SITE_OTHER): Payer: Medicare Other | Admitting: Internal Medicine

## 2020-08-11 VITALS — BP 156/68 | HR 74 | Temp 98.8°F | Resp 18 | Ht 61.0 in | Wt 211.0 lb

## 2020-08-11 DIAGNOSIS — Z9989 Dependence on other enabling machines and devices: Secondary | ICD-10-CM

## 2020-08-11 DIAGNOSIS — E669 Obesity, unspecified: Secondary | ICD-10-CM

## 2020-08-11 DIAGNOSIS — G4733 Obstructive sleep apnea (adult) (pediatric): Secondary | ICD-10-CM | POA: Diagnosis not present

## 2020-08-11 DIAGNOSIS — Z6839 Body mass index (BMI) 39.0-39.9, adult: Secondary | ICD-10-CM | POA: Diagnosis not present

## 2020-08-11 DIAGNOSIS — I10 Essential (primary) hypertension: Secondary | ICD-10-CM

## 2020-08-11 DIAGNOSIS — Z7189 Other specified counseling: Secondary | ICD-10-CM | POA: Diagnosis not present

## 2020-08-11 NOTE — Progress Notes (Signed)
[facility] [address] [place], [place] [address]  Pulmonary Sleep Medicine   Office Visit Note  Patient Name: Jacqueline Miller DOB: [birth year] MRN [id number]    Chief Complaint: Obstructive Sleep Apnea visit  Brief History:  Shanece is seen today for follow up The patient has a 7 year history of sleep apnea. Patient is using PAP nightly.  The patient feels more rested after sleeping with PAP.  The patient reports benefiting from PAP use. She sleeps better sleeping with the CPAP. She has been going to bed earlier to get more sleepy but her husband wakes her at 3:30 a.m.  She will nap for a few hours with CPAP in the afternoon. Reported sleepiness is  improved and the Epworth Sleepiness Score is 12 out of 24.The patient complains of the following: no complaints  The compliance download shows excellent compliance with an average use time of 7.7 hours. The AHI is 1.9  The patient does not complain of limb movements disrupting sleep.  ROS  General: (-) fever, (-) chills, (-) night sweat Nose and Sinuses: (-) nasal stuffiness or itchiness, (-) postnasal drip, (-) nosebleeds, (-) sinus trouble. Mouth and Throat: (-) sore throat, (-) hoarseness. Neck: (-) swollen glands, (-) enlarged thyroid, (-) neck pain. Respiratory: - cough, - shortness of breath, - wheezing. Neurologic: - numbness, - tingling. Psychiatric: - anxiety, - depression   Current Medication: Outpatient Encounter Medications as of 08/11/2020  Medication Sig   calcium carbonate (OSCAL) 1500 (600 Ca) MG TABS tablet Take 1 tablet by mouth daily.   ergocalciferol (VITAMIN D2) 1.25 MG (50000 UT) capsule Take 1 capsule by mouth once a week.   escitalopram (LEXAPRO) 20 MG tablet Take by mouth.   albuterol (PROVENTIL HFA;VENTOLIN HFA) 108 (90 Base) MCG/ACT inhaler Inhale 1 puff into the lungs every 6 (six) hours as needed for wheezing or shortness of breath.    Calcipotriene 0.005 % solution Apply 1 application  topically every 30 (thirty) days.   Calcipotriene-Betameth Diprop (ENSTILAR) 0.005-0.064 % FOAM Apply 1 application topically daily as needed (psoriasis under arms).    celecoxib (CELEBREX) 200 MG capsule Take 1 capsule (200 mg total) by mouth 2 (two) times daily.   Clobetasol Propionate 0.05 % shampoo Apply 1 application topically every 30 (thirty) days.   diphenhydrAMINE (BENADRYL) 25 MG tablet Take 25 mg by mouth 3 (three) times daily as needed for allergies.    enoxaparin (LOVENOX) 40 MG/0.4ML injection Inject 0.4 mLs (40 mg total) into the skin daily for 14 days.   escitalopram (LEXAPRO) 20 MG tablet Take 20 mg by mouth at bedtime.    Fluticasone-Salmeterol (ADVAIR) 250-50 MCG/DOSE AEPB Inhale 1 puff into the lungs 2 (two) times daily as needed (shortness of breath).   losartan (COZAAR) 25 MG tablet Take 25 mg by mouth daily.   oxyCODONE (OXY IR/ROXICODONE) 5 MG immediate release tablet Take 1 tablet (5 mg total) by mouth every 4 (four) hours as needed for moderate pain (pain score 4-6).   PRESCRIPTION MEDICATION Place 4 drops into the right eye in the morning, at noon, in the evening, and at bedtime. 3 in one eye drop   traMADol (ULTRAM) 50 MG tablet Take 1 tablet (50 mg total) by mouth every 4 (four) hours as needed for moderate pain.   Vitamin D, Ergocalciferol, (DRISDOL) 50000 units CAPS capsule Take 50,000 Units by mouth every 7 (seven) days.    No facility-administered encounter medications on file as of 08/11/2020.    Surgical  History: Past Surgical History:  Procedure Laterality Date   BREAST BIOPSY Left 1998   bx/ clip- neg   CHOLECYSTECTOMY N/A 04/09/2016   Procedure: LAPAROSCOPIC CHOLECYSTECTOMY WITH INTRAOPERATIVE CHOLANGIOGRAM;  Surgeon: Nadeen LandauJarvis Wilton Smith, MD;  Location: [facility] ORS;  Service: General;  Laterality: N/A;   DIAGNOSTIC LAPAROSCOPY     DILATION AND CURETTAGE, DIAGNOSTIC / THERAPEUTIC     EYE SURGERY Bilateral    cataract   KNEE ARTHROPLASTY  Right 09/10/2019   Procedure: COMPUTER ASSISTED TOTAL KNEE ARTHROPLASTY;  Surgeon: Donato HeinzHooten, James P, MD;  Location: [facility] ORS;  Service: Orthopedics;  Laterality: Right;   KNEE ARTHROPLASTY Left 05/05/2020   Procedure: COMPUTER ASSISTED TOTAL KNEE ARTHROPLASTY;  Surgeon: Donato HeinzHooten, James P, MD;  Location: [facility] OR[facility];  Service: Orthopedics;  Laterality: Left;   TUBAL LIGATION      Medical History: Past Medical History:  Diagnosis Date   Anemia    H/O   Anxiety    Arthritis    Asthma    WELL CONTROLLED   Complication of anesthesia    Depression    Dyspnea    HAS SEEN DR Gwen PoundsKOWALSKI AND HAD STRESS TEST IN MARCH 2017 WITH NORMAL RESULTS   GERD (gastroesophageal reflux disease)    OCC-TUMS   Headache    H/O MIGRAINES   Neuropathy    legs and right side   PONV (postoperative nausea and vomiting)    distant past   PTSD (post-traumatic stress disorder)    Sleep apnea    USES CPAP    Family History: Non contributory to the present illness  Social History: Social History   Socioeconomic History   Marital status: Married    Spouse name: Not on file   Number of children: Not on file   Years of education: Not on file   Highest education level: Not on file  Occupational History   Not on file  Tobacco Use   Smoking status: Never Smoker   Smokeless tobacco: Never Used  Vaping Use   Vaping Use: Never used  Substance and Sexual Activity   Alcohol use: Yes    Comment: OCC   Drug use: No   Sexual activity: Not on file  Other Topics Concern   Not on file  Social History Narrative   Not on file   Social Determinants of Health   Financial Resource Strain: Not on file  Food Insecurity: Not on file  Transportation Needs: Not on file  Physical Activity: Not on file  Stress: Not on file  Social Connections: Not on file  Intimate Partner Violence: Not on file    Vital Signs: Blood pressure (!) 156/68, pulse 74, temperature 98.8 F (37.1 C), temperature  source Temporal, resp. rate 18, height 5\' 1"  (1.549 m), weight 211 lb (95.7 kg), SpO2 94 %.  Examination: General Appearance: The patient is well-developed, well-nourished, and in no distress. Neck Circumference: 43 Skin: Gross inspection of skin unremarkable. Head: normocephalic, no gross deformities. Eyes: no gross deformities noted. ENT: ears appear grossly normal Neurologic: Alert and oriented. No involuntary movements.    EPWORTH SLEEPINESS SCALE:  Scale:  (0)= no chance of dozing; (1)= slight chance of dozing; (2)= moderate chance of dozing; (3)= high chance of dozing  Chance  Situtation    Sitting and reading: 1    Watching TV: 2    Sitting Inactive in public: 1    As a passenger in car: 3      Lying down to rest: 3  Sitting and talking: 1    Sitting quielty after lunch: 1    In a car, stopped in traffic: 0   TOTAL SCORE:   12 out of 24    SLEEP STUDIES:  1. PSG (07/31/13) AHI  32; RERA, low SpO2 88%   CPAP COMPLIANCE DATA:  Date Range: 08/09/19 - 08/07/20  Average Daily Use: 7:29 hours  Median Use: 7:39  Compliance for > 4 Hours: 96%  AHI: 1.9 respiratory events per hour  Days Used: 355/365  Mask Leak: 17  95th Percentile Pressure: 16.0 cmH2O         LABS: No results found for this or any previous visit (from the past 2160 hour(s)).  Radiology: MM 3D SCREEN BREAST BILATERAL  Result Date: 07/31/2020 CLINICAL DATA:  Screening. EXAM: DIGITAL SCREENING BILATERAL MAMMOGRAM WITH TOMOSYNTHESIS AND CAD TECHNIQUE: Bilateral screening digital craniocaudal and mediolateral oblique mammograms were obtained. Bilateral screening digital breast tomosynthesis was performed. The images were evaluated with computer-aided detection. COMPARISON:  Previous exam(s). ACR Breast Density Category b: There are scattered areas of fibroglandular density. FINDINGS: There are no findings suspicious for malignancy. The images were evaluated with computer-aided  detection. IMPRESSION: No mammographic evidence of malignancy. A result letter of this screening mammogram will be mailed directly to the patient. RECOMMENDATION: Screening mammogram in one year. (Code:SM-B-01Y) BI-RADS CATEGORY  1: Negative. Electronically Signed   By: Amie Portland M.D.   On: 07/31/2020 14:00    No results found.  MM 3D SCREEN BREAST BILATERAL  Result Date: 07/31/2020 CLINICAL DATA:  Screening. EXAM: DIGITAL SCREENING BILATERAL MAMMOGRAM WITH TOMOSYNTHESIS AND CAD TECHNIQUE: Bilateral screening digital craniocaudal and mediolateral oblique mammograms were obtained. Bilateral screening digital breast tomosynthesis was performed. The images were evaluated with computer-aided detection. COMPARISON:  Previous exam(s). ACR Breast Density Category b: There are scattered areas of fibroglandular density. FINDINGS: There are no findings suspicious for malignancy. The images were evaluated with computer-aided detection. IMPRESSION: No mammographic evidence of malignancy. A result letter of this screening mammogram will be mailed directly to the patient. RECOMMENDATION: Screening mammogram in one year. (Code:SM-B-01Y) BI-RADS CATEGORY  1: Negative. Electronically Signed   By: Amie Portland M.D.   On: 07/31/2020 14:00      Assessment and Plan: Patient Active Problem List   Diagnosis Date Noted   Hypertension 08/11/2020   CPAP use counseling 08/11/2020   Obesity with serious comorbidity 08/11/2020   Adult BMI 39.0-39.9 kg/sq m 08/11/2020   Total knee replacement status 05/05/2020   Status post total right knee replacement 09/10/2019   Headache 05/06/2019   Meralgia paresthetica 05/06/2019   Allergic conjunctivitis and rhinitis, bilateral 06/01/2016   Glucose intolerance (impaired glucose tolerance) 06/01/2016   Obesity, Class II, BMI 35-39.9 06/01/2016   OSA on CPAP 06/01/2016   Shortness of breath 07/14/2015   Asthma 09/10/2013   Atrophic vaginitis 09/10/2013    Vitamin D deficiency 09/10/2013   PTSD (post-traumatic stress disorder) 07/18/2013   1. OSA on CPAP The patient does tolerate PAP and reports significant benefit from PAP use. The patient was reminded to only use distilled water in the chamber and advised to increase evening bright light to help phase delay her husband. The patient was also counselled on the role of weight loss in treating sleep apnea.  The compliance has been excellent and the apnea is controlled. OSA- continue excellent compliance    2. CPAP use counseling CPAP Counseling: had a lengthy discussion with the patient regarding the importance of PAP therapy in  management of the sleep apnea. Patient appears to understand the risk factor reduction and also understands the risks associated with untreated sleep apnea. Patient will try to make a good faith effort to remain compliant with therapy. Also instructed the patient on proper cleaning of the device including the water must be changed daily if possible and use of distilled water is preferred. Patient understands that the machine should be regularly cleaned with appropriate recommended cleaning solutions that do not damage the PAP machine for example given white vinegar and water rinses. Other methods such as ozone treatment may not be as good as these simple methods to achieve cleaning.  3. Obesity with serious comorbidity, unspecified classification, unspecified obesity type Obesity Counseling: Had a lengthy discussion regarding patients BMI and weight issues. Patient was instructed on portion control as well as increased activity. Also discussed caloric restrictions with trying to maintain intake less than 2000 Kcal. Discussions were made in accordance with the 5As of weight management. Simple actions such as not eating late and if able to, taking a walk is suggested.  4. Adult BMI 39.0-39.9 kg/sq m Suggested a 10% of body weight goal for now.   5. Hypertension- suggested more  frequent monitoring. She is due to see her doctor in the  Next week for f/u. Hypertension Counseling:   The following hypertensive lifestyle modification were recommended and discussed:  1. Limiting alcohol intake to less than 1 oz/day of ethanol:(24 oz of beer or 8 oz of wine or 2 oz of 100-proof whiskey). 2. Take baby ASA 81 mg daily. 3. Importance of regular aerobic exercise and losing weight. 4. Reduce dietary saturated fat and cholesterol intake for overall cardiovascular health. 5. Maintaining adequate dietary potassium, calcium, and magnesium intake. 6. Regular monitoring of the blood pressure. 7. Reduce sodium intake to less than 100 mmol/day (less than 2.3 gm of sodium or less than 6 gm of sodium choride)    General Counseling: I have discussed the findings of the evaluation and examination with Darl Pikes.  I have also discussed any further diagnostic evaluation thatmay be needed or ordered today. Ivery verbalizes understanding of the findings of todays visit. We also reviewed her medications today and discussed drug interactions and side effects including but not limited excessive drowsiness and altered mental states. We also discussed that there is always a risk not just to her but also people around her. she has been encouraged to call the office with any questions or concerns that should arise related to todays visit.  No orders of the defined types were placed in this encounter.       I have personally obtained a history, examined the patient, evaluated laboratory and imaging results, formulated the assessment and plan and placed orders.  This patient was seen today by Emmaline Kluver, PA-C in collaboration with Dr. Freda Munro.  Valentino Hue Sol Blazing, PhD, FAASM  Diplomate, American Board of Sleep Medicine    Yevonne Pax, MD [facility] Diplomate ABMS Pulmonary and Critical Care Medicine Sleep medicine

## 2020-08-11 NOTE — Patient Instructions (Signed)

## 2021-04-14 NOTE — Progress Notes (Signed)
[facility] [address] [place], [place] [address]  Pulmonary Sleep Medicine   Office Visit Note  Patient Name: Jacqueline Miller DOB: 1946-01-18 MRN [id number]    Chief Complaint: Obstructive Sleep Apnea visit  Brief History:  [name] is seen today for follow up visit for APAP@ 4-20 cmH2O. The patient has a 7 year history of sleep apnea. Patient is using PAP nightly.  The patient feels rested after sleeping with PAP.  The patient reports benefiting from PAP use. Reported sleepiness is  improved and the Epworth Sleepiness Score is 16 out of 24. The patient does not take naps. The patient complains of the following: very loud snoring.  The compliance download shows 97% compliance with an average use time of 7 hours 48 minutes. The AHI is 2.0.  The patient does not complain of limb movements disrupting sleep.  ROS  General: (-) fever, (-) chills, (-) night sweat Nose and Sinuses: (-) nasal stuffiness or itchiness, (-) postnasal drip, (-) nosebleeds, (-) sinus trouble. Mouth and Throat: (-) sore throat, (-) hoarseness. Neck: (-) swollen glands, (-) enlarged thyroid, (-) neck pain. Respiratory: - cough, - shortness of breath, - wheezing. Neurologic: + numbness, + tingling. Psychiatric: + anxiety, + depression   Current Medication: Outpatient Encounter Medications as of 04/15/2021  Medication Sig   albuterol (PROVENTIL HFA;VENTOLIN HFA) 108 (90 Base) MCG/ACT inhaler Inhale 1 puff into the lungs every 6 (six) hours as needed for wheezing or shortness of breath.    Calcipotriene 0.005 % solution Apply 1 application topically every 30 (thirty) days.   Calcipotriene-Betameth Diprop (ENSTILAR) 0.005-0.064 % FOAM Apply 1 application topically daily as needed (psoriasis under arms).    calcium carbonate (OSCAL) 1500 (600 Ca) MG TABS tablet Take 1 tablet by mouth daily.   celecoxib (CELEBREX) 200 MG capsule Take 1 capsule (200 mg total) by mouth 2 (two) times daily.   Clobetasol  Propionate 0.05 % shampoo Apply 1 application topically every 30 (thirty) days.   diphenhydrAMINE (BENADRYL) 25 MG tablet Take 25 mg by mouth 3 (three) times daily as needed for allergies.    enoxaparin (LOVENOX) 40 MG/0.4ML injection Inject 0.4 mLs (40 mg total) into the skin daily for 14 days.   ergocalciferol (VITAMIN D2) 1.25 MG (50000 UT) capsule Take 1 capsule by mouth once a week.   escitalopram (LEXAPRO) 20 MG tablet Take 20 mg by mouth at bedtime.    escitalopram (LEXAPRO) 20 MG tablet Take by mouth.   Fluticasone-Salmeterol (ADVAIR) 250-50 MCG/DOSE AEPB Inhale 1 puff into the lungs 2 (two) times daily as needed (shortness of breath).   losartan (COZAAR) 25 MG tablet Take 25 mg by mouth daily.   oxyCODONE (OXY IR/ROXICODONE) 5 MG immediate release tablet Take 1 tablet (5 mg total) by mouth every 4 (four) hours as needed for moderate pain (pain score 4-6).   PRESCRIPTION MEDICATION Place 4 drops into the right eye in the morning, at noon, in the evening, and at bedtime. 3 in one eye drop   traMADol (ULTRAM) 50 MG tablet Take 1 tablet (50 mg total) by mouth every 4 (four) hours as needed for moderate pain.   Vitamin D, Ergocalciferol, (DRISDOL) 50000 units CAPS capsule Take 50,000 Units by mouth every 7 (seven) days.    No facility-administered encounter medications on file as of 04/15/2021.    Surgical History: Past Surgical History:  Procedure Laterality Date   BREAST BIOPSY Left 1998   bx/ clip- neg   CHOLECYSTECTOMY N/A 04/09/2016  Procedure: LAPAROSCOPIC CHOLECYSTECTOMY WITH INTRAOPERATIVE CHOLANGIOGRAM;  Surgeon: Nadeen Landau, MD;  Location: [facility] ORS;  Service: General;  Laterality: N/A;   DIAGNOSTIC LAPAROSCOPY     DILATION AND CURETTAGE, DIAGNOSTIC / THERAPEUTIC     EYE SURGERY Bilateral    cataract   KNEE ARTHROPLASTY Right 09/10/2019   Procedure: COMPUTER ASSISTED TOTAL KNEE ARTHROPLASTY;  Surgeon: Donato Heinz, MD;  Location: [facility] ORS;  Service: Orthopedics;   Laterality: Right;   KNEE ARTHROPLASTY Left 05/05/2020   Procedure: COMPUTER ASSISTED TOTAL KNEE ARTHROPLASTY;  Surgeon: Donato Heinz, MD;  Location: [facility] ORS;  Service: Orthopedics;  Laterality: Left;   TUBAL LIGATION      Medical History: Past Medical History:  Diagnosis Date   Anemia    H/O   Anxiety    Arthritis    Asthma    WELL CONTROLLED   Complication of anesthesia    Depression    Dyspnea    HAS SEEN DR Gwen Pounds AND HAD STRESS TEST IN MARCH 2017 WITH NORMAL RESULTS   GERD (gastroesophageal reflux disease)    OCC-TUMS   Headache    H/O MIGRAINES   Neuropathy    legs and right side   PONV (postoperative nausea and vomiting)    distant past   PTSD (post-traumatic stress disorder)    Sleep apnea    USES CPAP    Family History: Non contributory to the present illness  Social History: Social History   Socioeconomic History   Marital status: Married    Spouse name: Not on file   Number of children: Not on file   Years of education: Not on file   Highest education level: Not on file  Occupational History   Not on file  Tobacco Use   Smoking status: Never   Smokeless tobacco: Never  Vaping Use   Vaping Use: Never used  Substance and Sexual Activity   Alcohol use: Yes    Comment: OCC   Drug use: No   Sexual activity: Not on file  Other Topics Concern   Not on file  Social History Narrative   Not on file   Social Determinants of Health   Financial Resource Strain: Not on file  Food Insecurity: Not on file  Transportation Needs: Not on file  Physical Activity: Not on file  Stress: Not on file  Social Connections: Not on file  Intimate Partner Violence: Not on file    Vital Signs: Blood pressure (!) 163/77, pulse 86, resp. rate 18, height 5\' 1"  (1.549 m), weight 221 lb (100.2 kg), SpO2 95 %. Body mass index is 41.76 kg/m.    Examination: General Appearance: The patient is well-developed, well-nourished, and in no distress. Neck  Circumference: 54 cm Skin: Gross inspection of skin unremarkable. Head: normocephalic, no gross deformities. Eyes: no gross deformities noted. ENT: ears appear grossly normal Neurologic: Alert and oriented. No involuntary movements.    EPWORTH SLEEPINESS SCALE:  Scale:  (0)= no chance of dozing; (1)= slight chance of dozing; (2)= moderate chance of dozing; (3)= high chance of dozing  Chance  Situtation    Sitting and reading: 2    Watching TV: 2    Sitting Inactive in public: 2    As a passenger in car: 3      Lying down to rest: 3    Sitting and talking: 1    Sitting quielty after lunch: 3    In a car, stopped in traffic: 0   TOTAL SCORE:  16 out of 24    SLEEP STUDIES:  PSG (07/2013) AHI 32, RERA 50, minSpO2 88% Titration (08/2013) CPAP @ 13 cmh2O   CPAP COMPLIANCE DATA:  Date Range: 03/17/2021-04/15/2021  Average Daily Use: 7 hours 48 minutes  Median Use: 7 hours 40 minutes  Compliance for > 4 Hours: 97%  AHI: 2.0 respiratory events per hour  Days Used: 29/30 days  Mask Leak: 38.9  95th Percentile Pressure: 16.5         LABS: No results found for this or any previous visit (from the past 2160 hour(s)).  Radiology: MM 3D SCREEN BREAST BILATERAL  Result Date: 07/31/2020 CLINICAL DATA:  Screening. EXAM: DIGITAL SCREENING BILATERAL MAMMOGRAM WITH TOMOSYNTHESIS AND CAD TECHNIQUE: Bilateral screening digital craniocaudal and mediolateral oblique mammograms were obtained. Bilateral screening digital breast tomosynthesis was performed. The images were evaluated with computer-aided detection. COMPARISON:  Previous exam(s). ACR Breast Density Category b: There are scattered areas of fibroglandular density. FINDINGS: There are no findings suspicious for malignancy. The images were evaluated with computer-aided detection. IMPRESSION: No mammographic evidence of malignancy. A result letter of this screening mammogram will be mailed directly to the  patient. RECOMMENDATION: Screening mammogram in one year. (Code:SM-B-01Y) BI-RADS CATEGORY  1: Negative. Electronically Signed   By: Amie Portland M.D.   On: 07/31/2020 14:00    No results found.  No results found.    Assessment and Plan: Patient Active Problem List   Diagnosis Date Noted   Hypertension 08/11/2020   CPAP use counseling 08/11/2020   Obesity with serious comorbidity 08/11/2020   Adult BMI 39.0-39.9 kg/sq m 08/11/2020   Total knee replacement status 05/05/2020   Status post total right knee replacement 09/10/2019   Headache 05/06/2019   Meralgia paresthetica 05/06/2019   Allergic conjunctivitis and rhinitis, bilateral 06/01/2016   Glucose intolerance (impaired glucose tolerance) 06/01/2016   Obesity, Class II, BMI 35-39.9 06/01/2016   OSA on CPAP 06/01/2016   Shortness of breath 07/14/2015   Asthma 09/10/2013   Atrophic vaginitis 09/10/2013   Vitamin D deficiency 09/10/2013   PTSD (post-traumatic stress disorder) 07/18/2013      The patient does tolerate PAP and reports benefit from PAP use. The patient was reminded how to adjust mask fit and advised to change supplies regularly. The patient was also counselled on nightly use. The compliance is excellent. The AHI is 2.0.   1. OSA on CPAP Continue excellent compliance  2. CPAP use counseling CPAP couseling-Discussed importance of adequate CPAP use as well as proper care and cleaning techniques of machine and all supplies.  3. Snoring Continued snoring since the 90's despite cpap, advised to discuss with dentist and/or ENT to look into other solutions since well controlled on cpap already. May also try alternative mask or sleeping position.  4. Hypertension, unspecified type Elevated due to family stress. Continue current medication and monitoring and f/u with PCP.  5. Morbid obesity with BMI of 40.0-44.9, adult (HCC) Obesity Counseling: Had a lengthy discussion regarding patients BMI and weight issues.  Patient was instructed on portion control as well as increased activity. Also discussed caloric restrictions with trying to maintain intake less than 2000 Kcal. Discussions were made in accordance with the 5As of weight management. Simple actions such as not eating late and if able to, taking a walk is suggested.    General Counseling: I have discussed the findings of the evaluation and examination with Bernell.  I have also discussed any further diagnostic evaluation thatmay be needed or ordered  today. Thermon Leyland understanding of the findings of todays visit. We also reviewed her medications today and discussed drug interactions and side effects including but not limited excessive drowsiness and altered mental states. We also discussed that there is always a risk not just to her but also people around her. she has been encouraged to call the office with any questions or concerns that should arise related to todays visit.  No orders of the defined types were placed in this encounter.       I have personally obtained a history, examined the patient, evaluated laboratory and imaging results, formulated the assessment and plan and placed orders.  This patient was seen by Lynn Ito, PA-C in collaboration with Dr. Freda Munro as a part of collaborative care agreement.  Yevonne Pax, MD [facility] Diplomate ABMS Pulmonary Critical Care Medicine and Sleep Medicine

## 2021-04-15 ENCOUNTER — Ambulatory Visit (INDEPENDENT_AMBULATORY_CARE_PROVIDER_SITE_OTHER): Payer: Medicare Other | Admitting: Internal Medicine

## 2021-04-15 ENCOUNTER — Other Ambulatory Visit: Payer: Self-pay

## 2021-04-15 DIAGNOSIS — Z7189 Other specified counseling: Secondary | ICD-10-CM | POA: Diagnosis not present

## 2021-04-15 DIAGNOSIS — I10 Essential (primary) hypertension: Secondary | ICD-10-CM | POA: Diagnosis not present

## 2021-04-15 DIAGNOSIS — R0683 Snoring: Secondary | ICD-10-CM | POA: Diagnosis not present

## 2021-04-15 DIAGNOSIS — G4733 Obstructive sleep apnea (adult) (pediatric): Secondary | ICD-10-CM

## 2021-04-15 DIAGNOSIS — Z9989 Dependence on other enabling machines and devices: Secondary | ICD-10-CM

## 2021-04-15 DIAGNOSIS — Z6841 Body Mass Index (BMI) 40.0 and over, adult: Secondary | ICD-10-CM

## 2021-04-15 NOTE — Patient Instructions (Signed)

## 2021-07-23 ENCOUNTER — Other Ambulatory Visit: Payer: Self-pay | Admitting: Family Medicine

## 2021-07-23 DIAGNOSIS — Z780 Asymptomatic menopausal state: Secondary | ICD-10-CM

## 2021-07-23 DIAGNOSIS — Z1231 Encounter for screening mammogram for malignant neoplasm of breast: Secondary | ICD-10-CM

## 2021-08-11 NOTE — Congregational Nurse Program (Signed)
?  Dept: [phone] ? ? ?Congregational Nurse Program Note ? ?Date of Encounter: 08/11/2021 ?Client wanted her blood pressure taken. States she has recently started a keto diet and occasional has felt "I can feel the difference in my body "and denied palpitations when tapping her chest. Denies diagnosis of hypertension in past. Advised to contact her primary physician for follow. ? ?Past Medical History: ?Past Medical History:  ?Diagnosis Date  ? Anemia   ? H/O  ? Anxiety   ? Arthritis   ? Asthma   ? WELL CONTROLLED  ? Complication of anesthesia   ? Depression   ? Dyspnea   ? HAS SEEN DR Gwen Pounds AND HAD STRESS TEST IN MARCH 2017 WITH NORMAL RESULTS  ? GERD (gastroesophageal reflux disease)   ? OCC-TUMS  ? Headache   ? H/O MIGRAINES  ? Neuropathy   ? legs and right side  ? PONV (postoperative nausea and vomiting)   ? distant past  ? PTSD (post-traumatic stress disorder)   ? Sleep apnea   ? USES CPAP  ? ? ?Encounter Details: ? CNP Questionnaire - 08/11/21 1023   ? ?  ? Questionnaire  ? Do you give verbal consent to treat you today? Yes   ? Location Patient Served  Not Applicable   ? Visit Setting Church or Organization   ? Patient Status Unknown   live in own home  ? Insurance Medicare   ? Insurance Referral N/A   ? Medication N/A   ? Medical Provider Yes   ? Screening Referrals N/A   ? Medical Referral N/A   ? Medical Appointment Made N/A   ? Food N/A   ? Transportation N/A   ? Housing/Utilities N/A   ? Interpersonal Safety N/A   ? Intervention Blood pressure   ? ED Visit Averted N/A   ? Life-Saving Intervention Made N/A   ? ?  ?  ? ?  ? ? ? ? ?

## 2021-09-21 ENCOUNTER — Ambulatory Visit
Admission: RE | Admit: 2021-09-21 | Discharge: 2021-09-21 | Disposition: A | Discharge status: Home / Self Care | Payer: Medicare Other | Source: Ambulatory Visit | Attending: Family Medicine | Admitting: Family Medicine

## 2021-09-21 DIAGNOSIS — Z1231 Encounter for screening mammogram for malignant neoplasm of breast: Secondary | ICD-10-CM | POA: Insufficient documentation

## 2021-09-21 DIAGNOSIS — Z780 Asymptomatic menopausal state: Secondary | ICD-10-CM | POA: Insufficient documentation

## 2021-11-09 DIAGNOSIS — F325 Major depressive disorder, single episode, in full remission: Secondary | ICD-10-CM | POA: Insufficient documentation

## 2022-07-19 ENCOUNTER — Ambulatory Visit (INDEPENDENT_AMBULATORY_CARE_PROVIDER_SITE_OTHER): Payer: Medicare Other | Admitting: Internal Medicine

## 2022-07-19 VITALS — BP 123/66 | HR 77 | Resp 16 | Ht 61.0 in | Wt 220.0 lb

## 2022-07-19 DIAGNOSIS — I10 Essential (primary) hypertension: Secondary | ICD-10-CM

## 2022-07-19 DIAGNOSIS — G2581 Restless legs syndrome: Secondary | ICD-10-CM

## 2022-07-19 DIAGNOSIS — F4310 Post-traumatic stress disorder, unspecified: Secondary | ICD-10-CM | POA: Insufficient documentation

## 2022-07-19 DIAGNOSIS — G4733 Obstructive sleep apnea (adult) (pediatric): Secondary | ICD-10-CM | POA: Diagnosis not present

## 2022-07-19 DIAGNOSIS — Z7189 Other specified counseling: Secondary | ICD-10-CM

## 2022-07-19 NOTE — Patient Instructions (Signed)

## 2022-07-19 NOTE — Progress Notes (Signed)
[facility] [place], [place] [address]  Pulmonary Sleep Medicine   Office Visit Note  Patient Name: Jacqueline Miller DOB: 1946/04/05 MRN [id number]    Chief Complaint: Obstructive Sleep Apnea visit  Brief History:  Jacqueline Miller is seen today for an annual follow up on APAP at 4-20 cmh20. The patient has a 8 year history of sleep apnea. Patient is using PAP nightly.  The patient feels not rested after sleeping with PAP.  The patient reports not benefiting from PAP use. Reported sleepiness is  improved and the Epworth Sleepiness Score is 17 out of 24. The patient does take naps, about 3 times per week. The patient complains of the following: She doesn't feel like it is working like it used to and she is not getting enough air.  She has some issue with mask leak.  The compliance download shows 92% compliance with an average use time of 6 hours 32 minutes. The AHI is 3.0.  The patient does complain of limb movements disrupting sleep.  ROS  General: (-) fever, (-) chills, (-) night sweat Nose and Sinuses: (-) nasal stuffiness or itchiness, (-) postnasal drip, (-) nosebleeds, (-) sinus trouble. Mouth and Throat: (-) sore throat, (-) hoarseness. Neck: (-) swollen glands, (-) enlarged thyroid, (-) neck pain. Respiratory: + cough, - shortness of breath, - wheezing. Neurologic: - numbness, - tingling. Psychiatric: - anxiety, - depression   Current Medication: Outpatient Encounter Medications as of 07/19/2022  Medication Sig   atorvastatin (LIPITOR) 20 MG tablet    Clobetasol Propionate 0.05 % shampoo Apply topically.   escitalopram (LEXAPRO) 20 MG tablet Take by mouth.   fluticasone-salmeterol (ADVAIR) 250-50 MCG/ACT AEPB Inhale into the lungs.   albuterol (PROVENTIL HFA;VENTOLIN HFA) 108 (90 Base) MCG/ACT inhaler Inhale 1 puff into the lungs every 6 (six) hours as needed for wheezing or shortness of breath.    Calcipotriene 0.005 % solution Apply 1 application topically  every 30 (thirty) days.   Calcipotriene-Betameth Diprop (ENSTILAR) 0.005-0.064 % FOAM Apply 1 application topically daily as needed (psoriasis under arms).    calcium carbonate (OSCAL) 1500 (600 Ca) MG TABS tablet Take 1 tablet by mouth daily.   celecoxib (CELEBREX) 200 MG capsule Take 1 capsule (200 mg total) by mouth 2 (two) times daily.   diphenhydrAMINE (BENADRYL) 25 MG tablet Take 25 mg by mouth 3 (three) times daily as needed for allergies.    ergocalciferol (VITAMIN D2) 1.25 MG (50000 UT) capsule Take 1 capsule by mouth once a week.   lisinopril (ZESTRIL) 20 MG tablet Take by mouth.   PRESCRIPTION MEDICATION Place 4 drops into the right eye in the morning, at noon, in the evening, and at bedtime. 3 in one eye drop   [DISCONTINUED] Clobetasol Propionate 0.05 % shampoo Apply 1 application topically every 30 (thirty) days.   [DISCONTINUED] enoxaparin (LOVENOX) 40 MG/0.4ML injection Inject 0.4 mLs (40 mg total) into the skin daily for 14 days.   [DISCONTINUED] escitalopram (LEXAPRO) 20 MG tablet Take 20 mg by mouth at bedtime.    [DISCONTINUED] escitalopram (LEXAPRO) 20 MG tablet Take by mouth.   [DISCONTINUED] Fluticasone-Salmeterol (ADVAIR) 250-50 MCG/DOSE AEPB Inhale 1 puff into the lungs 2 (two) times daily as needed (shortness of breath).   [DISCONTINUED] losartan (COZAAR) 25 MG tablet Take 25 mg by mouth daily.   [DISCONTINUED] oxyCODONE (OXY IR/ROXICODONE) 5 MG immediate release tablet Take 1 tablet (5 mg total) by mouth every 4 (four) hours as needed for moderate pain (pain score  4-6).   [DISCONTINUED] traMADol (ULTRAM) 50 MG tablet Take 1 tablet (50 mg total) by mouth every 4 (four) hours as needed for moderate pain.   [DISCONTINUED] Vitamin D, Ergocalciferol, (DRISDOL) 50000 units CAPS capsule Take 50,000 Units by mouth every 7 (seven) days.    No facility-administered encounter medications on file as of 07/19/2022.    Surgical History: Past Surgical History:  Procedure Laterality  Date   BREAST BIOPSY Left 1998   bx/ clip- neg   CHOLECYSTECTOMY N/A 04/09/2016   Procedure: LAPAROSCOPIC CHOLECYSTECTOMY WITH INTRAOPERATIVE CHOLANGIOGRAM;  Surgeon: Leonie Green, MD;  Location: [facility] ORS;  Service: General;  Laterality: N/A;   DIAGNOSTIC LAPAROSCOPY     DILATION AND CURETTAGE, DIAGNOSTIC / THERAPEUTIC     EYE SURGERY Bilateral    cataract   KNEE ARTHROPLASTY Right 09/10/2019   Procedure: COMPUTER ASSISTED TOTAL KNEE ARTHROPLASTY;  Surgeon: Dereck Leep, MD;  Location: [facility] ORS;  Service: Orthopedics;  Laterality: Right;   KNEE ARTHROPLASTY Left 05/05/2020   Procedure: COMPUTER ASSISTED TOTAL KNEE ARTHROPLASTY;  Surgeon: Dereck Leep, MD;  Location: [facility] ORS;  Service: Orthopedics;  Laterality: Left;   TUBAL LIGATION      Medical History: Past Medical History:  Diagnosis Date   Anemia    H/O   Anxiety    Arthritis    Asthma    WELL CONTROLLED   Complication of anesthesia    Depression    Dyspnea    HAS SEEN DR Nehemiah Massed AND HAD STRESS TEST IN MARCH 2017 WITH NORMAL RESULTS   GERD (gastroesophageal reflux disease)    OCC-TUMS   Headache    H/O MIGRAINES   Neuropathy    legs and right side   PONV (postoperative nausea and vomiting)    distant past   PTSD (post-traumatic stress disorder)    Sleep apnea    USES CPAP    Family History: Non contributory to the present illness  Social History: Social History   Socioeconomic History   Marital status: Married    Spouse name: Not on file   Number of children: Not on file   Years of education: Not on file   Highest education level: Not on file  Occupational History   Not on file  Tobacco Use   Smoking status: Never   Smokeless tobacco: Never  Vaping Use   Vaping Use: Never used  Substance and Sexual Activity   Alcohol use: Yes    Comment: OCC   Drug use: No   Sexual activity: Not on file  Other Topics Concern   Not on file  Social History Narrative   Not on file   Social  Determinants of Health   Financial Resource Strain: Not on file  Food Insecurity: Not on file  Transportation Needs: Not on file  Physical Activity: Not on file  Stress: Not on file  Social Connections: Not on file  Intimate Partner Violence: Not on file    Vital Signs: Blood pressure 123/66, pulse 77, resp. rate 16, height '5\' 1"'$  (1.549 m), weight 220 lb (99.8 kg), SpO2 93 %. Body mass index is 41.57 kg/m.    Examination: General Appearance: The patient is well-developed, well-nourished, and in no distress. Neck Circumference: 47 cm Skin: Gross inspection of skin unremarkable. Head: normocephalic, no gross deformities. Eyes: no gross deformities noted. ENT: ears appear grossly normal Neurologic: Alert and oriented. No involuntary movements.  STOP BANG RISK ASSESSMENT S (snore) Have you been told that you snore?  YES   T (tired) Are you often tired, fatigued, or sleepy during the day?   YES  O (obstruction) Do you stop breathing, choke, or gasp during sleep? YES   P (pressure) Do you have or are you being treated for high blood pressure? YES   B (BMI) Is your body index greater than 35 kg/m? YES   A (age) Are you 75 years old or older? YES   N (neck) Do you have a neck circumference greater than 16 inches?   YES   G (gender) Are you a female? NO   TOTAL STOP/BANG "YES" ANSWERS 7       A STOP-Bang score of 2 or less is considered low risk, and a score of 5 or more is high risk for having either moderate or severe OSA. For people who score 3 or 4, doctors may need to perform further assessment to determine how likely they are to have OSA.         EPWORTH SLEEPINESS SCALE:  Scale:  (0)= no chance of dozing; (1)= slight chance of dozing; (2)= moderate chance of dozing; (3)= high chance of dozing  Chance  Situtation    Sitting and reading: 2    Watching TV: 3    Sitting Inactive in public: 2    As a passenger in car: 3      Lying down to rest: 3    Sitting  and talking: 1    Sitting quielty after lunch: 3    In a car, stopped in traffic: 0   TOTAL SCORE:   17 out of 24    SLEEP STUDIES:  PSG (07/2013) AHI 32/hr, RERA 50, min SpO2 88% Titration (08/2013) CPAP@ 13 cmH2O   CPAP COMPLIANCE DATA:  Date Range: 07/14/21 -07/13/22  Average Daily Use: 6 hours 32 minutes  Median Use: 6 hours 39 minutes  Compliance for > 4 Hours: 337 days  AHI: 3.0 respiratory events per hour  Days Used: 348/365  Mask Leak: 38.2  95th Percentile Pressure: 16.6 cmh20         LABS: No results found for this or any previous visit (from the past 2160 hour(s)).  Radiology: MM 3D SCREEN BREAST BILATERAL  Result Date: 09/21/2021 CLINICAL DATA:  Screening. EXAM: DIGITAL SCREENING BILATERAL MAMMOGRAM WITH TOMOSYNTHESIS AND CAD TECHNIQUE: Bilateral screening digital craniocaudal and mediolateral oblique mammograms were obtained. Bilateral screening digital breast tomosynthesis was performed. The images were evaluated with computer-aided detection. COMPARISON:  Previous exam(s). ACR Breast Density Category b: There are scattered areas of fibroglandular density. FINDINGS: There are no findings suspicious for malignancy. IMPRESSION: No mammographic evidence of malignancy. A result letter of this screening mammogram will be mailed directly to the patient. RECOMMENDATION: Screening mammogram in one year. (Code:SM-B-01Y) BI-RADS CATEGORY  1: Negative. Electronically Signed   By: Dorise Bullion III M.D.   On: 09/21/2021 15:07   DG BONE DENSITY (DXA)  Result Date: 09/21/2021 EXAM: DUAL X-RAY ABSORPTIOMETRY (DXA) FOR BONE MINERAL DENSITY IMPRESSION: Your patient Jacqueline Miller completed a BMD test on 09/21/2021 using the [employer] (analysis version: 14.10) manufactured by [employer]. The following summarizes the results of our evaluation. Technologist: LCE PATIENT BIOGRAPHICAL: Name: Zaylynn, Miller Patient ID: [id number] Birth Date:  [birth year] Height: 61.0 in. Gender: Female Exam Date: 09/21/2021 Weight: 218.6 lbs. Indications: asthma, Caucasian, POSTmenopausal Fractures: Treatments: ASSESSMENT: The BMD measured at Forearm Radius 33% is 0.869 g/cm2 with a T-score of -0.1. This patient is considered normal according  to [place]) criteria. The scan quality is good. Site Region Measured Measured WHO Young Adult BMD Date       Age      Classification T-score AP Spine L1-L4 11/21/[age] 73.5 Normal 2.2 1.467 g/cm2 DualFemur Neck Right 09/21/2021 73.5 Normal -0.1 1.018 g/cm2 Left Forearm Radius 33% 09[age]21/2021 73.5 Normal -0.1 0.869 g/cm2 World Health Organization [facility]) criteria for post-menopausal, Caucasian Women: Normal:       T-score at or above -1 SD Osteopenia:   T-score between -1 and -2.5 SD Osteoporosis: T-score at or below -2.5 SD RECOMMENDATIONS: 1. All patients should optimize calcium and vitamin D intake. 2. Consider FDA-approved medical therapies in postmenopausal women and men aged 87 years and older, based on the following: a. A hip or vertebral (clinical or morphometric) fracture b. T-score < -2.5 at the femoral neck or spine after appropriate evaluation to exclude secondary causes c. Low bone mass (T-score between -1.0 and -2.5 at the femoral neck or spine) and a 10-year probability of a hip fracture > 3% or a 10-year probability of a major osteoporosis-related fracture > 20% based on the [place]-adapted WHO algorithm d. Clinician judgment and/or patient preferences may indicate treatment for people with 10-year fracture probabilities above or below these levels FOLLOW-UP: People with diagnosed cases of osteoporosis or at high risk for fracture should have regular bone mineral density tests. For patients eligible for Medicare, routine testing is allowed once every 2 years. The testing frequency can be increased to one year for patients who have rapidly progressing disease, those who are receiving or discontinuing  medical therapy to restore bone mass, or have additional risk factors. I have reviewed this report, and agree with the above findings. [facility] Radiology Electronically Signed   By: Elmer Picker M.D.   On: 09/21/2021 11:48    No results found.  No results found.    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   Major depressive disorder with single episode, in full remission ([place]) 11/09/2021   Hypertension 08/11/2020   CPAP use counseling 08/11/2020   Obesity with serious comorbidity 08/11/2020   Adult BMI 39.0-39.9 kg/sq m 08/11/2020   Total knee replacement status 05/05/2020   Status post total right knee replacement 09/10/2019   Headache 05/06/2019   Meralgia paresthetica 05/06/2019   Allergic conjunctivitis and rhinitis, bilateral 06/01/2016   Glucose intolerance (impaired glucose tolerance) 06/01/2016   Obesity, Class II, BMI 35-39.9 06/01/2016   OSA on CPAP 06/01/2016   Shortness of breath 07/14/2015   Asthma 09/10/2013   Atrophic vaginitis 09/10/2013   Vitamin D deficiency 09/10/2013   PTSD (post-traumatic stress disorder) 07/18/2013    1. OSA on CPAP The patient does tolerate PAP and reports little benefit from PAP use. She does describe restless leg symptoms. Her AHI is variable- and she has been using benadryl most nights. I have advised her to discontinue this. She will also stop drinking caffeine after 9am. We will do a 2 week download. If no improvement we will do a titration as she has daytime sleepiness and snoring. If improved, she will f/u in one year. The patient was reminded how to clean equipment and advised to replace supplies routinely. The patient was also counselled on weight loss. The compliance is very good. The AHI is 3.0.   OSA on cpap- variable control- some nights with up to 10 events/hour. Will have her hold benadryl and caffeine, f/u  download in 2 weeks.   2.  CPAP use counseling CPAP Counseling: had a lengthy discussion with the patient regarding the importance of PAP therapy in management of the sleep apnea. Patient appears to understand the risk factor reduction and also understands the risks associated with untreated sleep apnea. Patient will try to make a good faith effort to remain compliant with therapy. Also instructed the patient on proper cleaning of the device including the water must be changed daily if possible and use of distilled water is preferred. Patient understands that the machine should be regularly cleaned with appropriate recommended cleaning solutions that do not damage the PAP machine for example given white vinegar and water rinses. Other methods such as ozone treatment may not be as good as these simple methods to achieve cleaning.   3. Hypertension, unspecified type Hypertension Counseling:   The following hypertensive lifestyle modification were recommended and discussed:  1. Limiting alcohol intake to less than 1 oz/day of ethanol:(24 oz of beer or 8 oz of wine or 2 oz of 100-proof whiskey). 2. Take baby ASA 81 mg daily. 3. Importance of regular aerobic exercise and losing weight. 4. Reduce dietary saturated fat and cholesterol intake for overall cardiovascular health. 5. Maintaining adequate dietary potassium, calcium, and magnesium intake. 6. Regular monitoring of the blood pressure. 7. Reduce sodium intake to less than 100 mmol/day (less than 2.3 gm of sodium or less than 6 gm of sodium choride)    4. Restless legs syndrome This may be triggering apnea, particularly if she is using benadryl. She will stop this and we will  do a download and f/u depending on how she does.    General Counseling: I have discussed the findings of the evaluation and examination with Jacqueline Miller.  I have also discussed any further diagnostic evaluation thatmay be needed or ordered today. Jacqueline Miller verbalizes understanding of the findings of todays visit.  We also reviewed her medications today and discussed drug interactions and side effects including but not limited excessive drowsiness and altered mental states. We also discussed that there is always a risk not just to her but also people around her. she has been encouraged to call the office with any questions or concerns that should arise related to todays visit.  No orders of the defined types were placed in this encounter.       I have personally obtained a history, examined the patient, evaluated laboratory and imaging results, formulated the assessment and plan and placed orders. This patient was seen today by Tressie Ellis, PA-C in collaboration with Dr. Devona Konig.   Allyne Gee, MD [facility] Diplomate ABMS Pulmonary Critical Care Medicine and Sleep Medicine

## 2022-08-27 ENCOUNTER — Other Ambulatory Visit: Payer: Self-pay | Admitting: Family Medicine

## 2022-08-27 DIAGNOSIS — Z1231 Encounter for screening mammogram for malignant neoplasm of breast: Secondary | ICD-10-CM

## 2022-09-23 ENCOUNTER — Ambulatory Visit
Admission: RE | Admit: 2022-09-23 | Discharge: 2022-09-23 | Disposition: A | Discharge status: Home / Self Care | Payer: Medicare Other | Source: Ambulatory Visit | Attending: Family Medicine | Admitting: Family Medicine

## 2022-09-23 DIAGNOSIS — Z1231 Encounter for screening mammogram for malignant neoplasm of breast: Secondary | ICD-10-CM | POA: Diagnosis present

## 2022-10-25 IMAGING — MG MM DIGITAL SCREENING BILAT W/ TOMO AND CAD
8 series · 8 of 24 positions shown · non-contrast
Comparison: Previous exam(s).

CLINICAL DATA: Screening.

EXAM:
DIGITAL SCREENING BILATERAL MAMMOGRAM WITH TOMOSYNTHESIS AND CAD
TECHNIQUE: Bilateral screening digital craniocaudal and mediolateral oblique
mammograms were obtained. Bilateral screening digital breast
tomosynthesis was performed. The images were evaluated with
computer-aided detection.

[L CC synth-2D]
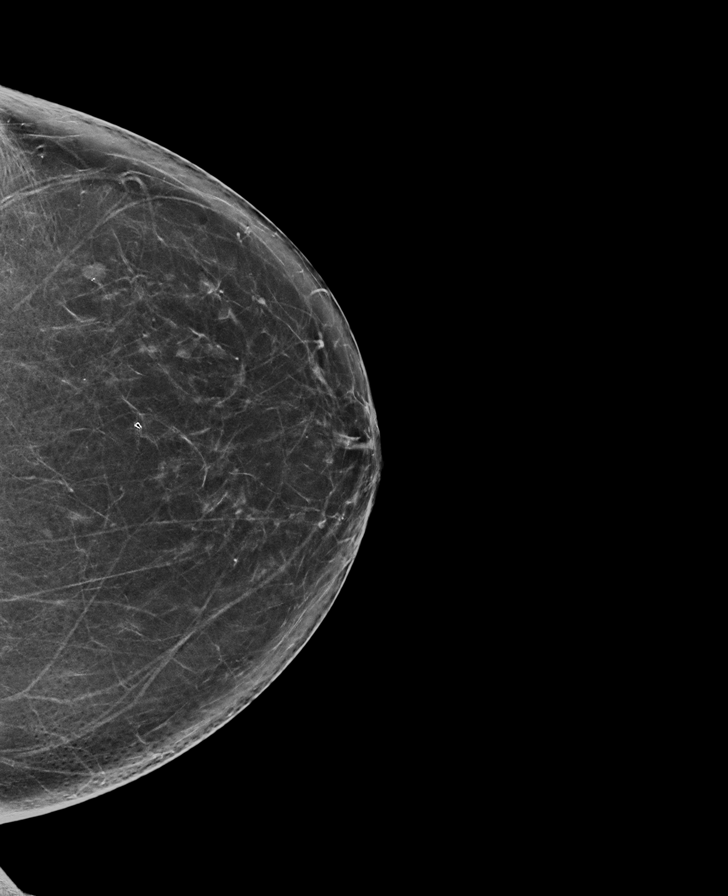

[L MLO synth-2D]
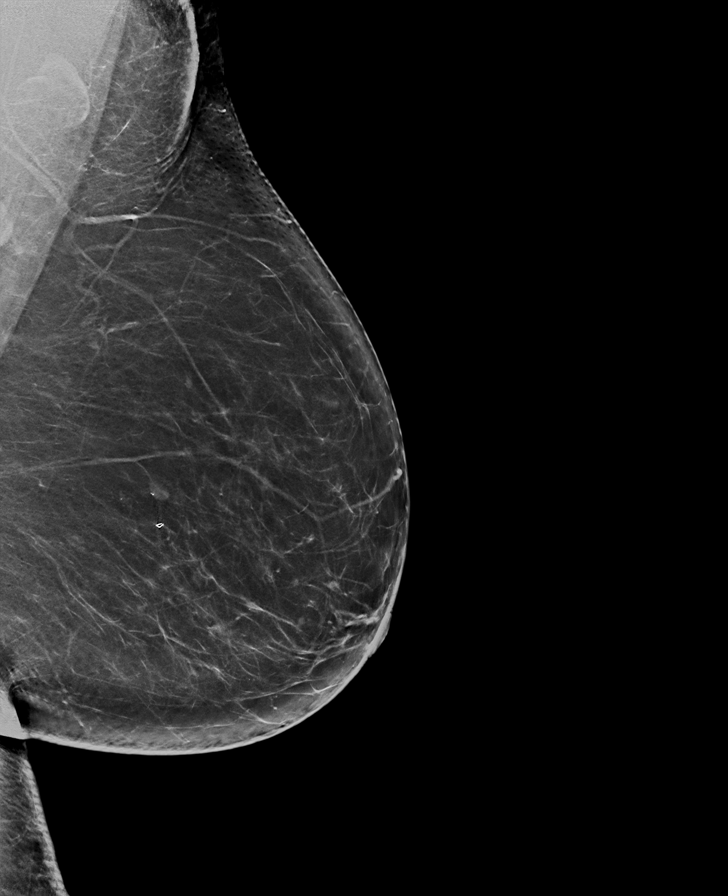

[R CC synth-2D]
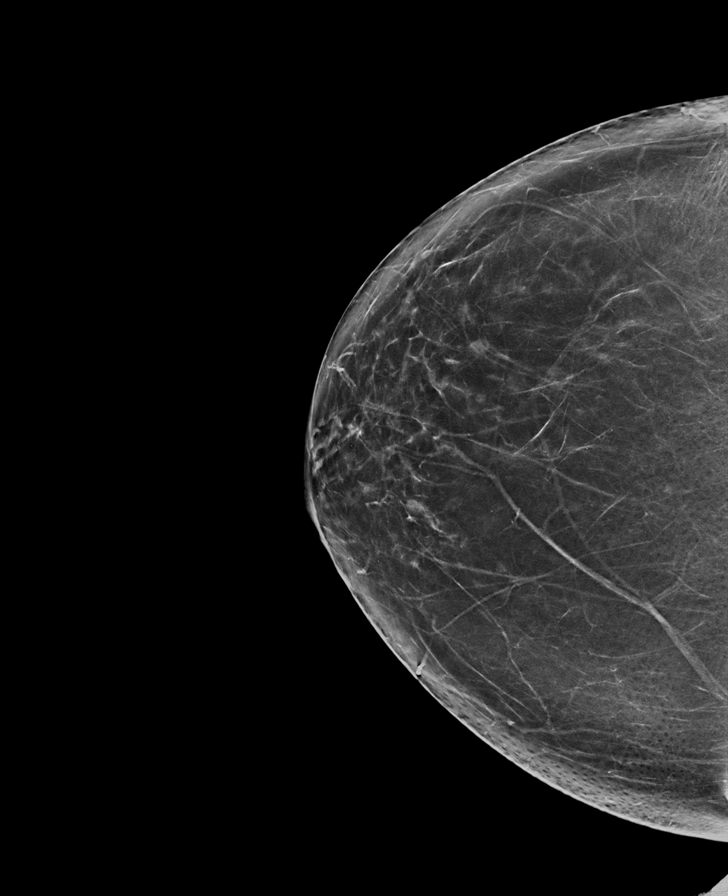

[R MLO synth-2D]
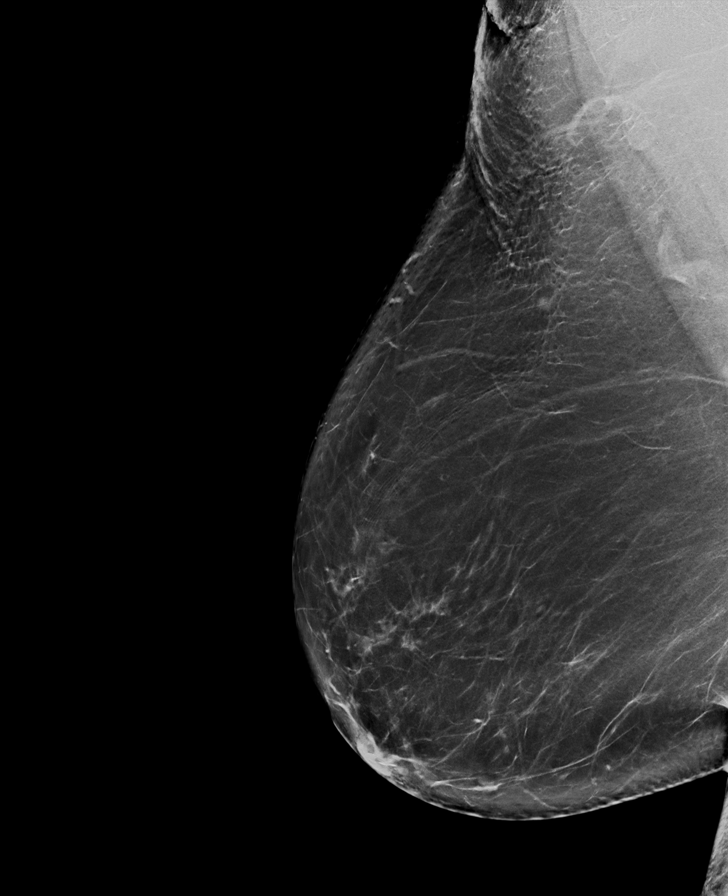

[L MLO tomo · tomo slice 43/84.0]
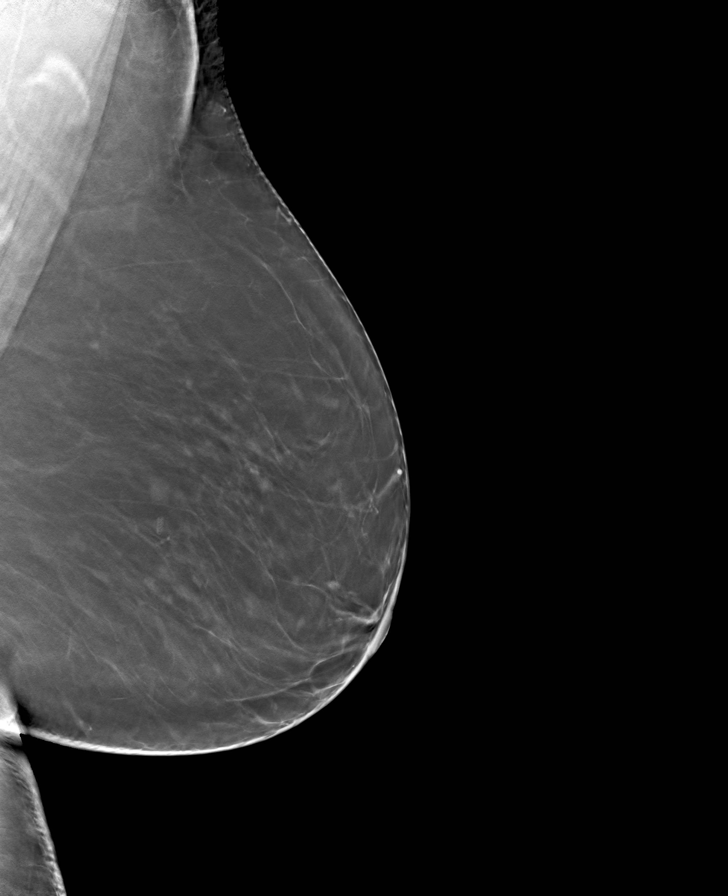

[L CC tomo · tomo slice 39/76.0]
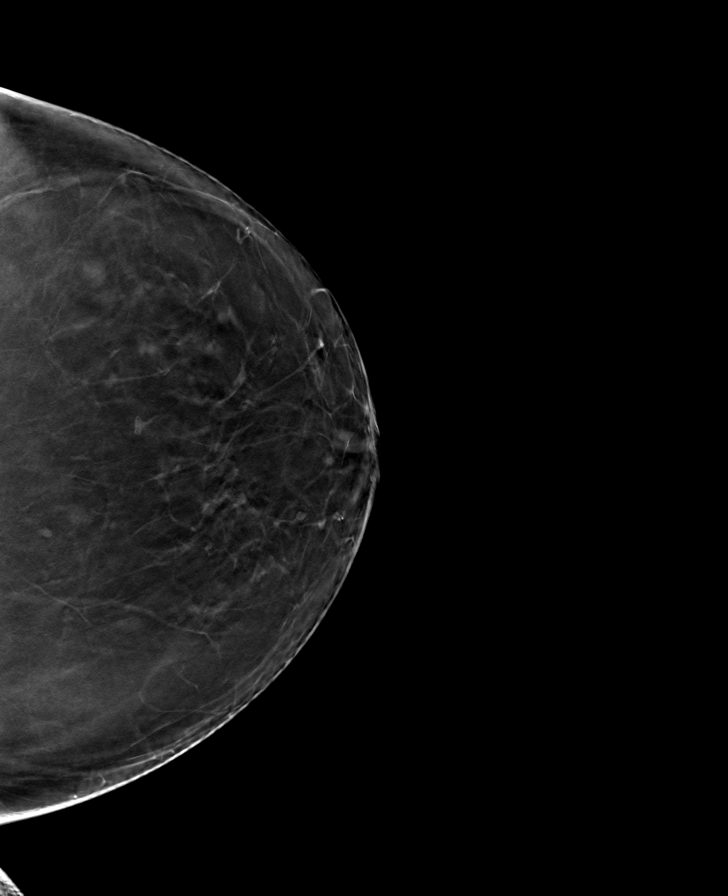

[R MLO tomo · tomo slice 49/96.0]
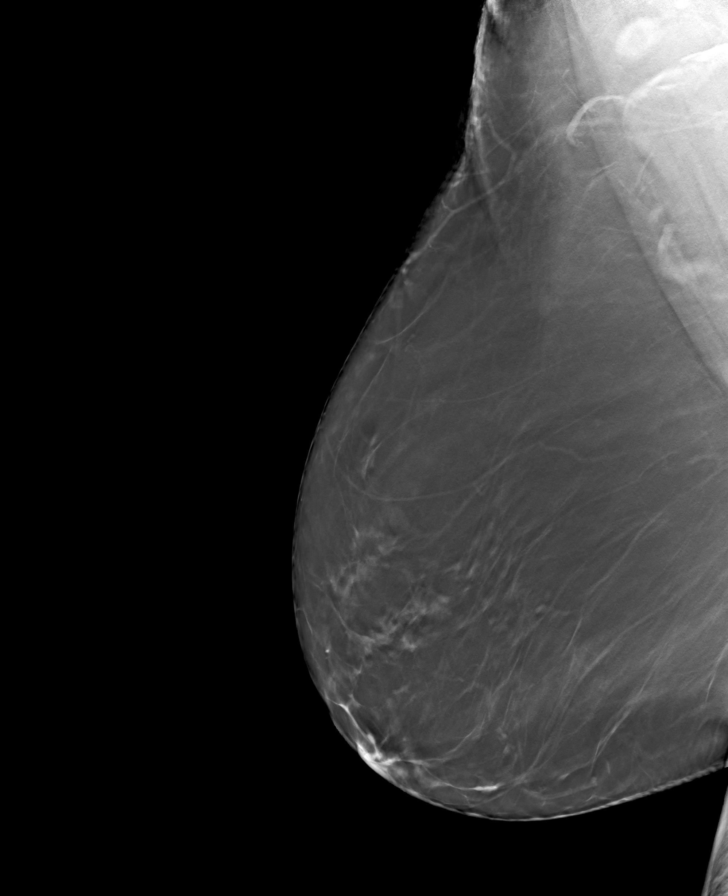

[R CC tomo · tomo slice 39/77.0]
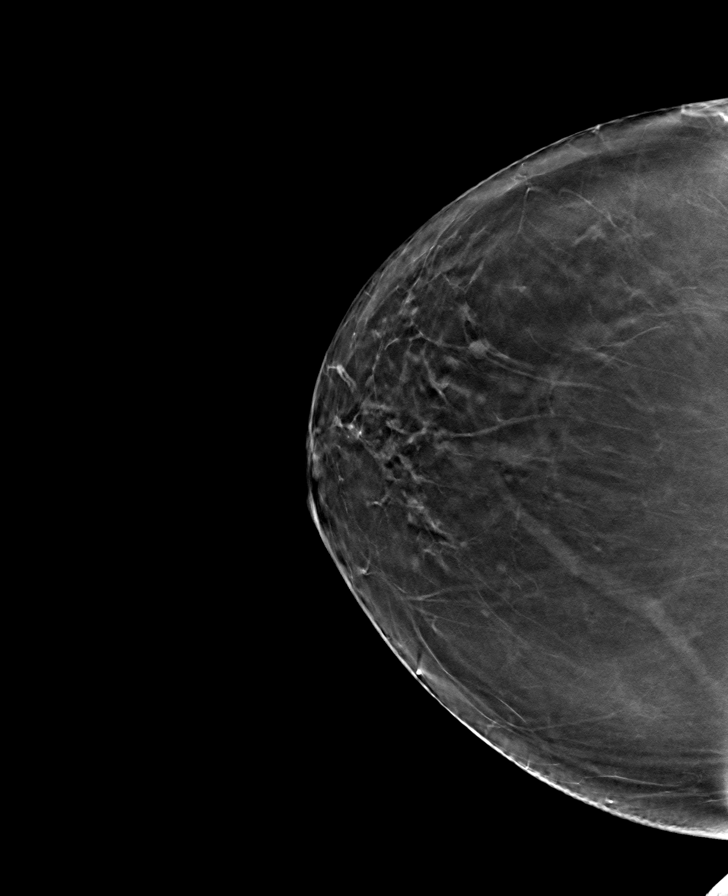

[8 of 24 positions shown; findings below may reference images not displayed]

ACR Breast Density Category b: There are scattered areas of
fibroglandular density.
FINDINGS: There are no findings suspicious for malignancy.
IMPRESSION: No mammographic evidence of malignancy. A result letter of this
screening mammogram will be mailed directly to the patient.

RECOMMENDATION:
Screening mammogram in one year. (Code:51-O-LD2)

BI-RADS CATEGORY  1: Negative.

## 2023-06-16 ENCOUNTER — Encounter: Payer: Self-pay | Admitting: Gastroenterology

## 2023-06-22 NOTE — H&P (Signed)
Pre-Procedure H&P   Patient ID: Jacqueline Miller is a 78 y.o. female.  Gastroenterology Provider: Jaynie Collins, DO  Referring Provider: Tawni Pummel, PA PCP: Ethelda Chick, MD  Date: 06/23/2023  HPI Ms. Jacqueline Miller is a 78 y.o. female who presents today for Esophagogastroduodenoscopy and Colonoscopy for Dysphagia, personal history of colon polyps .  Patient with dysphagia a few times a week to food liquids and pills noting upper esophageal sticking (above the clavicle/suprasternal notch).  Requiring repetitive swallowing to wash down feeling of things sticking.  No GERD nausea or vomiting.  Notes symptoms are worsened with eating too fast or talking while eating.  Daily bowel movement without melena or hematochezia.  No abdominal pain  Last underwent colonoscopy in May 2019 with 1 adenomatous polyp and internal hemorrhoids Status post cholecystectomy Hemoglobin 13.8 MCV 90 platelets 255,000 creatinine 0.6   Past Medical History:  Diagnosis Date   Anemia    H/O   Anxiety    Arthritis    Asthma    WELL CONTROLLED   Complication of anesthesia    Depression    Diabetes mellitus without complication (HCC)    Dyspnea    HAS SEEN DR Gwen Pounds AND HAD STRESS TEST IN MARCH 2017 WITH NORMAL RESULTS   GERD (gastroesophageal reflux disease)    OCC-TUMS   Headache    H/O MIGRAINES   Hypertension    Neuropathy    legs and right side   PONV (postoperative nausea and vomiting)    distant past   Postmenopausal atrophic vaginitis    Psoriasis    PTSD (post-traumatic stress disorder)    Sleep apnea    USES CPAP    Past Surgical History:  Procedure Laterality Date   BREAST BIOPSY Left 1998   bx/ clip- neg   CHOLECYSTECTOMY N/A 04/09/2016   Procedure: LAPAROSCOPIC CHOLECYSTECTOMY WITH INTRAOPERATIVE CHOLANGIOGRAM;  Surgeon: Nadeen Landau, MD;  Location: [facility] ORS;  Service: General;  Laterality: N/A;   DIAGNOSTIC LAPAROSCOPY     DILATION AND CURETTAGE,  DIAGNOSTIC / THERAPEUTIC     EYE SURGERY Bilateral    cataract   JOINT REPLACEMENT     KNEE ARTHROPLASTY Right 09/10/2019   Procedure: COMPUTER ASSISTED TOTAL KNEE ARTHROPLASTY;  Surgeon: Donato Heinz, MD;  Location: [facility] ORS;  Service: Orthopedics;  Laterality: Right;   KNEE ARTHROPLASTY Left 05/05/2020   Procedure: COMPUTER ASSISTED TOTAL KNEE ARTHROPLASTY;  Surgeon: Donato Heinz, MD;  Location: [facility] ORS;  Service: Orthopedics;  Laterality: Left;   TUBAL LIGATION      Family History Paternal grandmother with pancreatic cancer No other h/o GI disease or malignancy  Review of Systems  Constitutional:  Negative for activity change, appetite change, chills, diaphoresis, fatigue, fever and unexpected weight change.  HENT:  Positive for trouble swallowing. Negative for voice change.   Respiratory:  Negative for shortness of breath and wheezing.   Cardiovascular:  Negative for chest pain, palpitations and leg swelling.  Gastrointestinal:  Negative for abdominal distention, abdominal pain, anal bleeding, blood in stool, constipation, diarrhea, nausea, rectal pain and vomiting.  Musculoskeletal:  Negative for arthralgias and myalgias.  Skin:  Negative for color change and pallor.  Neurological:  Negative for dizziness, syncope and weakness.  Psychiatric/Behavioral:  Negative for confusion.   All other systems reviewed and are negative.    Medications No current facility-administered medications on file prior to encounter.   Current Outpatient Medications on File Prior to Encounter  Medication Sig Dispense Refill  albuterol (PROVENTIL HFA;VENTOLIN HFA) 108 (90 Base) MCG/ACT inhaler Inhale 1 puff into the lungs every 6 (six) hours as needed for wheezing or shortness of breath.      calcium carbonate (OSCAL) 1500 (600 Ca) MG TABS tablet Take 1 tablet by mouth daily.     ergocalciferol (VITAMIN D2) 1.25 MG (50000 UT) capsule Take 1 capsule by mouth once a week.     metFORMIN  (GLUCOPHAGE) 500 MG tablet Take 500 mg by mouth daily with breakfast.     atorvastatin (LIPITOR) 20 MG tablet      Calcipotriene 0.005 % solution Apply 1 application topically every 30 (thirty) days.     Calcipotriene-Betameth Diprop (ENSTILAR) 0.005-0.064 % FOAM Apply 1 application topically daily as needed (psoriasis under arms).      celecoxib (CELEBREX) 200 MG capsule Take 1 capsule (200 mg total) by mouth 2 (two) times daily. 90 capsule 0   Clobetasol Propionate 0.05 % shampoo Apply topically.     diphenhydrAMINE (BENADRYL) 25 MG tablet Take 25 mg by mouth 3 (three) times daily as needed for allergies.      escitalopram (LEXAPRO) 20 MG tablet Take by mouth.     fluticasone-salmeterol (ADVAIR) 250-50 MCG/ACT AEPB Inhale into the lungs.     lisinopril (ZESTRIL) 20 MG tablet Take by mouth.     PRESCRIPTION MEDICATION Place 4 drops into the right eye in the morning, at noon, in the evening, and at bedtime. 3 in one eye drop      Pertinent medications related to GI and procedure were reviewed by me with the patient prior to the procedure   Current Facility-Administered Medications:    0.9 %  sodium chloride infusion, , Intravenous, Continuous, Jaynie Collins, DO, Last Rate: 20 mL/hr at 06/23/23 1246, New Bag at 06/23/23 1246  sodium chloride 20 mL/hr at 06/23/23 1246       Allergies  Allergen Reactions   Other Nausea And Vomiting    TREE NUTS   Tetracycline Other (See Comments)    headaches   Allergies were reviewed by me prior to the procedure  Objective   Body mass index is 39.51 kg/m. Vitals:   06/23/23 1226 06/23/23 1240  BP:  (!) 142/72  Pulse:  75  Resp:  18  Temp:  (!) 96 F (35.6 C)  TempSrc:  Temporal  SpO2:  97%  Weight: 98 kg 98 kg  Height: 5\' 2"  (1.575 m) 5\' 2"  (1.575 m)     Physical Exam Vitals and nursing note reviewed.  Constitutional:      General: She is not in acute distress.    Appearance: Normal appearance. She is obese. She is not  ill-appearing, toxic-appearing or diaphoretic.  HENT:     Head: Normocephalic and atraumatic.     Nose: Nose normal.     Mouth/Throat:     Mouth: Mucous membranes are moist.     Pharynx: Oropharynx is clear.  Eyes:     General: No scleral icterus.    Extraocular Movements: Extraocular movements intact.  Cardiovascular:     Rate and Rhythm: Normal rate and regular rhythm.     Heart sounds: Normal heart sounds. No murmur heard.    No friction rub. No gallop.  Pulmonary:     Effort: Pulmonary effort is normal. No respiratory distress.     Breath sounds: Normal breath sounds. No wheezing, rhonchi or rales.  Abdominal:     General: Bowel sounds are normal. There is no distension.  Palpations: Abdomen is soft.     Tenderness: There is no abdominal tenderness. There is no guarding or rebound.  Musculoskeletal:     Cervical back: Neck supple.     Right lower leg: No edema.     Left lower leg: No edema.  Skin:    General: Skin is warm and dry.     Coloration: Skin is not jaundiced or pale.  Neurological:     General: No focal deficit present.     Mental Status: She is alert and oriented to person, place, and time. Mental status is at baseline.  Psychiatric:        Mood and Affect: Mood normal.        Behavior: Behavior normal.        Thought Content: Thought content normal.        Judgment: Judgment normal.      Assessment:  Ms. Jacqueline Miller is a 78 y.o. female  who presents today for Esophagogastroduodenoscopy and Colonoscopy for Dysphagia, personal history of colon polyps .  Plan:  Esophagogastroduodenoscopy and Colonoscopy with possible intervention today  Esophagogastroduodenoscopy and Colonoscopy with possible biopsy, control of bleeding, polypectomy, and interventions as necessary has been discussed with the patient/patient representative. Informed consent was obtained from the patient/patient representative after explaining the indication, nature, and risks of the  procedure including but not limited to death, bleeding, perforation, missed neoplasm/lesions, cardiorespiratory compromise, and reaction to medications. Opportunity for questions was given and appropriate answers were provided. Patient/patient representative has verbalized understanding is amenable to undergoing the procedure.   Jaynie Collins, DO  [facility]  Portions of the record may have been created with voice recognition software. Occasional wrong-word or 'sound-a-like' substitutions may have occurred due to the inherent limitations of voice recognition software.  Read the chart carefully and recognize, using context, where substitutions may have occurred.

## 2023-06-23 ENCOUNTER — Ambulatory Visit: Payer: Medicare Other | Admitting: Anesthesiology

## 2023-06-23 ENCOUNTER — Ambulatory Visit
Admission: RE | Admit: 2023-06-23 | Discharge: 2023-06-23 | Disposition: A | Discharge status: Home / Self Care | Payer: Medicare Other | Attending: Gastroenterology | Admitting: Gastroenterology

## 2023-06-23 ENCOUNTER — Encounter: Payer: Self-pay | Admitting: Gastroenterology

## 2023-06-23 ENCOUNTER — Encounter
Admission: RE | Disposition: A | Discharge status: Home / Self Care | Payer: Self-pay | Source: Home / Self Care | Attending: Gastroenterology

## 2023-06-23 ENCOUNTER — Other Ambulatory Visit: Payer: Self-pay

## 2023-06-23 DIAGNOSIS — J45909 Unspecified asthma, uncomplicated: Secondary | ICD-10-CM | POA: Diagnosis not present

## 2023-06-23 DIAGNOSIS — K219 Gastro-esophageal reflux disease without esophagitis: Secondary | ICD-10-CM | POA: Diagnosis not present

## 2023-06-23 DIAGNOSIS — E66813 Obesity, class 3: Secondary | ICD-10-CM | POA: Diagnosis not present

## 2023-06-23 DIAGNOSIS — K2980 Duodenitis without bleeding: Secondary | ICD-10-CM | POA: Diagnosis not present

## 2023-06-23 DIAGNOSIS — E119 Type 2 diabetes mellitus without complications: Secondary | ICD-10-CM | POA: Insufficient documentation

## 2023-06-23 DIAGNOSIS — Z6839 Body mass index (BMI) 39.0-39.9, adult: Secondary | ICD-10-CM | POA: Diagnosis not present

## 2023-06-23 DIAGNOSIS — G709 Myoneural disorder, unspecified: Secondary | ICD-10-CM | POA: Insufficient documentation

## 2023-06-23 DIAGNOSIS — K635 Polyp of colon: Secondary | ICD-10-CM | POA: Insufficient documentation

## 2023-06-23 DIAGNOSIS — Z860101 Personal history of adenomatous and serrated colon polyps: Secondary | ICD-10-CM | POA: Diagnosis present

## 2023-06-23 DIAGNOSIS — G4730 Sleep apnea, unspecified: Secondary | ICD-10-CM | POA: Insufficient documentation

## 2023-06-23 DIAGNOSIS — K3189 Other diseases of stomach and duodenum: Secondary | ICD-10-CM | POA: Diagnosis not present

## 2023-06-23 DIAGNOSIS — Z1211 Encounter for screening for malignant neoplasm of colon: Secondary | ICD-10-CM | POA: Diagnosis not present

## 2023-06-23 DIAGNOSIS — I10 Essential (primary) hypertension: Secondary | ICD-10-CM | POA: Insufficient documentation

## 2023-06-23 DIAGNOSIS — K2950 Unspecified chronic gastritis without bleeding: Secondary | ICD-10-CM | POA: Insufficient documentation

## 2023-06-23 DIAGNOSIS — D122 Benign neoplasm of ascending colon: Secondary | ICD-10-CM | POA: Diagnosis not present

## 2023-06-23 DIAGNOSIS — Z7984 Long term (current) use of oral hypoglycemic drugs: Secondary | ICD-10-CM | POA: Diagnosis not present

## 2023-06-23 DIAGNOSIS — K644 Residual hemorrhoidal skin tags: Secondary | ICD-10-CM | POA: Insufficient documentation

## 2023-06-23 DIAGNOSIS — R1310 Dysphagia, unspecified: Secondary | ICD-10-CM | POA: Diagnosis present

## 2023-06-23 DIAGNOSIS — K2289 Other specified disease of esophagus: Secondary | ICD-10-CM | POA: Insufficient documentation

## 2023-06-23 DIAGNOSIS — F418 Other specified anxiety disorders: Secondary | ICD-10-CM | POA: Insufficient documentation

## 2023-06-23 DIAGNOSIS — K648 Other hemorrhoids: Secondary | ICD-10-CM | POA: Insufficient documentation

## 2023-06-23 HISTORY — PX: ESOPHAGOGASTRODUODENOSCOPY (EGD) WITH PROPOFOL: SHX5813

## 2023-06-23 HISTORY — PX: BIOPSY: SHX5522

## 2023-06-23 HISTORY — DX: Postmenopausal atrophic vaginitis: N95.2

## 2023-06-23 HISTORY — DX: Essential (primary) hypertension: I10

## 2023-06-23 HISTORY — DX: Type 2 diabetes mellitus without complications: E11.9

## 2023-06-23 HISTORY — PX: MALONEY DILATION: SHX5535

## 2023-06-23 HISTORY — PX: COLONOSCOPY WITH PROPOFOL: SHX5780

## 2023-06-23 HISTORY — PX: POLYPECTOMY: SHX5525

## 2023-06-23 HISTORY — DX: Psoriasis, unspecified: L40.9

## 2023-06-23 SURGERY — COLONOSCOPY WITH PROPOFOL
Anesthesia: General

## 2023-06-23 MED ORDER — SODIUM CHLORIDE 0.9 % IV SOLN: INTRAVENOUS | Status: DC

## 2023-06-23 MED ORDER — PROPOFOL 1000 MG/100ML IV EMUL
INTRAVENOUS | Status: AC
  Filled 2023-06-23: qty 100

## 2023-06-23 MED ORDER — PROPOFOL 10 MG/ML IV BOLUS
INTRAVENOUS | Status: DC | PRN
  Administered 2023-06-23 (×2): 50 mg via INTRAVENOUS

## 2023-06-23 MED ORDER — GLYCOPYRROLATE 0.2 MG/ML IJ SOLN
INTRAMUSCULAR | Status: AC
Start: 2023-06-23 — End: ?
  Filled 2023-06-23: qty 1

## 2023-06-23 MED ORDER — ALBUTEROL SULFATE HFA 108 (90 BASE) MCG/ACT IN AERS
INHALATION_SPRAY | RESPIRATORY_TRACT | Status: DC | PRN
  Administered 2023-06-23: 6 via RESPIRATORY_TRACT

## 2023-06-23 MED ORDER — ALBUTEROL SULFATE HFA 108 (90 BASE) MCG/ACT IN AERS
INHALATION_SPRAY | RESPIRATORY_TRACT | Status: AC
  Filled 2023-06-23: qty 6.7

## 2023-06-23 MED ORDER — LIDOCAINE HCL (PF) 2 % IJ SOLN
INTRAMUSCULAR | Status: AC
  Filled 2023-06-23: qty 5

## 2023-06-23 MED ORDER — LIDOCAINE HCL (CARDIAC) PF 100 MG/5ML IV SOSY
PREFILLED_SYRINGE | INTRAVENOUS | Status: DC | PRN
  Administered 2023-06-23: 100 mg via INTRAVENOUS

## 2023-06-23 MED ORDER — GLYCOPYRROLATE 0.2 MG/ML IJ SOLN
INTRAMUSCULAR | Status: DC | PRN
  Administered 2023-06-23: .2 mg via INTRAVENOUS

## 2023-06-23 MED ORDER — PHENYLEPHRINE 80 MCG/ML (10ML) SYRINGE FOR IV PUSH (FOR BLOOD PRESSURE SUPPORT): PREFILLED_SYRINGE | INTRAVENOUS | Status: DC | PRN

## 2023-06-23 MED ORDER — PROPOFOL 500 MG/50ML IV EMUL
INTRAVENOUS | Status: DC | PRN
  Administered 2023-06-23: 75 ug/kg/min via INTRAVENOUS

## 2023-06-23 MED ORDER — DEXMEDETOMIDINE HCL IN NACL 80 MCG/20ML IV SOLN
INTRAVENOUS | Status: DC | PRN
  Administered 2023-06-23: 20 ug via INTRAVENOUS

## 2023-06-23 NOTE — Transfer of Care (Signed)
Immediate Anesthesia Transfer of Care Note  Patient: KHARTER SESTAK  Procedure(s) Performed: COLONOSCOPY WITH PROPOFOL ESOPHAGOGASTRODUODENOSCOPY (EGD) WITH PROPOFOL BIOPSY MALONEY DILATION POLYPECTOMY  Patient Location: PACU  Anesthesia Type:General  Level of Consciousness: sedated  Airway & Oxygen Therapy: Patient Spontanous Breathing and Patient connected to nasal cannula oxygen  Post-op Assessment: Report given to RN and Post -op Vital signs reviewed and stable  Post vital signs: Reviewed and stable  Last Vitals:  Vitals Value Taken Time  BP 103/62 06/23/23 1419  Temp 36.1 C 06/23/23 1418  Pulse 71 06/23/23 1420  Resp 15 06/23/23 1420  SpO2 96 % 06/23/23 1420  Vitals shown include unfiled device data.  Last Pain:  Vitals:   06/23/23 1418  TempSrc: Temporal  PainSc: Asleep         Complications: No notable events documented.

## 2023-06-23 NOTE — Op Note (Signed)
[facility] Gastroenterology Patient Name: Jacqueline Miller Procedure Date: 06/23/2023 1:14 PM MRN: [id number] Account #: [id number] Date of Birth: [birth year] Admit Type: Outpatient Age: 78 Room: [facility] ENDO ROOM 1 Gender: Female Note Status: Finalized Instrument Name: Colonscope [id number] Procedure:             Colonoscopy Indications:           High risk colon cancer surveillance: Personal history                         of colonic polyps Providers:             Trenda Moots, DO Referring MD:          Myrle Sheng. Katrinka Blazing, MD (Referring MD) Medicines:             Monitored Anesthesia Care Complications:         No immediate complications. Estimated blood loss:                         Minimal. Procedure:             Pre-Anesthesia Assessment:                        - Prior to the procedure, a History and Physical was                         performed, and patient medications and allergies were                         reviewed. The patient is competent. The risks and                         benefits of the procedure and the sedation options and                         risks were discussed with the patient. All questions                         were answered and informed consent was obtained.                         Patient identification and proposed procedure were                         verified by the physician, the nurse, the anesthetist                         and the technician in the endoscopy suite. Mental                         Status Examination: alert and oriented. Airway                         Examination: normal oropharyngeal airway and neck                         mobility. Respiratory Examination: clear to  auscultation. CV Examination: regular rate and rhythm.                         Prophylactic Antibiotics: The patient does not require                         prophylactic antibiotics. Prior Anticoagulants: The                          patient has taken no anticoagulant or antiplatelet                         agents. ASA Grade Assessment: III - A patient with                         severe systemic disease. After reviewing the risks and                         benefits, the patient was deemed in satisfactory                         condition to undergo the procedure. The anesthesia                         plan was to use monitored anesthesia care (MAC).                         Immediately prior to administration of medications,                         the patient was re-assessed for adequacy to receive                         sedatives. The heart rate, respiratory rate, oxygen                         saturations, blood pressure, adequacy of pulmonary                         ventilation, and response to care were monitored                         throughout the procedure. The physical status of the                         patient was re-assessed after the procedure.                        After obtaining informed consent, the colonoscope was                         passed under direct vision. Throughout the procedure,                         the patient's blood pressure, pulse, and oxygen                         saturations were monitored continuously. The  Colonoscope was introduced through the anus and                         advanced to the the cecum, identified by appendiceal                         orifice and ileocecal valve. The colonoscopy was                         performed with difficulty due to a redundant colon,                         significant looping and the patient's body habitus.                         Successful completion of the procedure was aided by                         straightening and shortening the scope to obtain bowel                         loop reduction, using scope torsion, applying                         abdominal pressure and lavage. The patient  tolerated                         the procedure well. The quality of the bowel                         preparation was evaluated using the BBPS [facility] Bowel                         Preparation Scale) with scores of: Right Colon = 3,                         Transverse Colon = 3 and Left Colon = 3 (entire mucosa                         seen well with no residual staining, small fragments                         of stool or opaque liquid). The total BBPS score                         equals 9. The ileocecal valve, appendiceal orifice,                         and rectum were photographed. Findings:      The perianal and digital rectal examinations were normal. Pertinent       negatives include normal sphincter tone.      Hemorrhoids were found on perianal exam.      Four sessile polyps were found in the transverse colon (2), ascending       colon and cecum. The polyps were 1 to 2 mm in size. These polyps were       removed with a jumbo cold  forceps. Resection and retrieval were       complete. Estimated blood loss was minimal.      The colon (entire examined portion) was significantly redundant.       Estimated blood loss: none.      Non-bleeding external and internal hemorrhoids were found during       retroflexion and during perianal exam.      The exam was otherwise without abnormality on direct and retroflexion       views. Impression:            - Hemorrhoids found on perianal exam.                        - Four 1 to 2 mm polyps in the transverse colon, in                         the ascending colon and in the cecum, removed with a                         jumbo cold forceps. Resected and retrieved.                        - Redundant colon.                        - Non-bleeding external and internal hemorrhoids.                        - The examination was otherwise normal on direct and                         retroflexion views. Recommendation:        - Patient has a contact number  available for                         emergencies. The signs and symptoms of potential                         delayed complications were discussed with the patient.                         Return to normal activities tomorrow. Written                         discharge instructions were provided to the patient.                        - Discharge patient to home.                        - Resume previous diet.                        - Continue present medications.                        - Await pathology results.                        - No repeat surveillance colonoscopy indicated given  age                        - Return to GI office as previously scheduled.                        - The findings and recommendations were discussed with                         the patient. Procedure Code(s):     --- Professional ---                        [phone], Colonoscopy, flexible; with biopsy, single or                         multiple Diagnosis Code(s):     --- Professional ---                        Z86.010, Personal history of colonic polyps                        D12.3, Benign neoplasm of transverse colon (hepatic                         flexure or splenic flexure)                        D12.2, Benign neoplasm of ascending colon                        D12.0, Benign neoplasm of cecum                        K64.8, Other hemorrhoids                        Q43.8, Other specified congenital malformations of                         intestine CPT copyright 2022 American [employer] Association. All rights reserved. The codes documented in this report are preliminary and upon coder review may  be revised to meet current compliance requirements. Attending Participation:      I personally performed the entire procedure. Elfredia Nevins, DO Jaynie Collins DO, DO 06/23/2023 2:18:55 PM This report has been signed electronically. Number of Addenda: 0 Note Initiated On: 06/23/2023 1:14  PM Scope Withdrawal Time: 0 hours 12 minutes 45 seconds  Total Procedure Duration: 0 hours 23 minutes 44 seconds  Estimated Blood Loss:  Estimated blood loss was minimal. Estimated blood loss                         was minimal.      [facility]

## 2023-06-23 NOTE — Interval H&P Note (Signed)
History and Physical Interval Note: Preprocedure H&P from 06/23/23  was reviewed and there was no interval change after seeing and examining the patient.  Written consent was obtained from the patient after discussion of risks, benefits, and alternatives. Patient has consented to proceed with Esophagogastroduodenoscopy and Colonoscopy with possible intervention   06/23/2023 1:18 PM  Jacqueline Miller  has presented today for surgery, with the diagnosis of Z86.0100 (ICD-10-CM) - History of colon polyps R13.10 (ICD-10-CM) - Dysphagia, unspecified type.  The various methods of treatment have been discussed with the patient and family. After consideration of risks, benefits and other options for treatment, the patient has consented to  Procedure(s): COLONOSCOPY WITH PROPOFOL (N/A) ESOPHAGOGASTRODUODENOSCOPY (EGD) WITH PROPOFOL (N/A) as a surgical intervention.  The patient's history has been reviewed, patient examined, no change in status, stable for surgery.  I have reviewed the patient's chart and labs.  Questions were answered to the patient's satisfaction.     Jaynie Collins

## 2023-06-23 NOTE — Op Note (Signed)
[facility] Patient Name: Jacqueline Miller Procedure Date: 06/23/2023 1:15 PM MRN: [id number] Account #: [id number] Date of Birth: 1946-05-18 Admit Type: Outpatient Age: 78 Room: [facility] ENDO ROOM 1 Gender: Female Note Status: Finalized Instrument Name: Upper Endoscope [id number] Procedure:             Upper GI endoscopy Indications:           Dysphagia Providers:             Trenda Moots, DO Referring MD:          Myrle Sheng. Katrinka Blazing, MD (Referring MD) Medicines:             Monitored Anesthesia Care Complications:         No immediate complications. Estimated blood loss:                         Minimal. Procedure:             Pre-Anesthesia Assessment:                        - Prior to the procedure, a History and Physical was                         performed, and patient medications and allergies were                         reviewed. The patient is competent. The risks and                         benefits of the procedure and the sedation options and                         risks were discussed with the patient. All questions                         were answered and informed consent was obtained.                         Patient identification and proposed procedure were                         verified by the physician, the nurse, the anesthetist                         and the technician in the endoscopy suite. Mental                         Status Examination: alert and oriented. Airway                         Examination: normal oropharyngeal airway and neck                         mobility. Respiratory Examination: clear to                         auscultation. CV Examination: regular rate and rhythm.  Prophylactic Antibiotics: The patient does not require                         prophylactic antibiotics. Prior Anticoagulants: The                         patient has taken no anticoagulant or antiplatelet                          agents. ASA Grade Assessment: III - A patient with                         severe systemic disease. After reviewing the risks and                         benefits, the patient was deemed in satisfactory                         condition to undergo the procedure. The anesthesia                         plan was to use monitored anesthesia care (MAC).                         Immediately prior to administration of medications,                         the patient was re-assessed for adequacy to receive                         sedatives. The heart rate, respiratory rate, oxygen                         saturations, blood pressure, adequacy of pulmonary                         ventilation, and response to care were monitored                         throughout the procedure. The physical status of the                         patient was re-assessed after the procedure.                        After obtaining informed consent, the endoscope was                         passed under direct vision. Throughout the procedure,                         the patient's blood pressure, pulse, and oxygen                         saturations were monitored continuously. The Endoscope                         was introduced through the mouth, and advanced to the  third part of duodenum. The upper GI endoscopy was                         accomplished without difficulty. The patient tolerated                         the procedure well. Findings:      Localized mild inflammation characterized by erosions and erythema was       found in the duodenal bulb. Estimated blood loss: none.      The exam of the duodenum was otherwise normal.      Localized mild inflammation characterized by erythema was found in the       gastric antrum. Biopsies were taken with a cold forceps for Helicobacter       pylori testing. Estimated blood loss was minimal.      The exam of the stomach was otherwise  normal.      The Z-line was irregular. island of salmon colored mucosa ~ 1 cm above       irregular z line Biopsies were taken with a cold forceps for histology.       Estimated blood loss was minimal.      Esophagogastric landmarks were identified: the gastroesophageal junction       was found at 40 cm from the incisors.      No endoscopic abnormality was evident in the esophagus to explain the       patient's complaint of dysphagia. It was decided, however, to proceed       with dilation of the entire esophagus. The scope was withdrawn. Dilation       was performed with a Maloney dilator with no resistance at 52 Fr. The       dilation site was examined following endoscope reinsertion and showed no       change. Estimated blood loss: none.      The exam of the esophagus was otherwise normal. Impression:            - Duodenitis.                        - Gastritis. Biopsied.                        - Z-line irregular. Biopsied.                        - Esophagogastric landmarks identified.                        - No endoscopic esophageal abnormality to explain                         patient's dysphagia. Esophagus dilated. Dilated. Recommendation:        - Patient has a contact number available for                         emergencies. The signs and symptoms of potential                         delayed complications were discussed with the patient.  Return to normal activities tomorrow. Written                         discharge instructions were provided to the patient.                        - Discharge patient to home (ambulatory).                        - Resume previous diet.                        - Continue present medications.                        - Await pathology results.                        - Repeat upper endoscopy for surveillance based on                         pathology results.                        - Return to GI office as previously  scheduled.                        - The findings and recommendations were discussed with                         the patient. Procedure Code(s):     --- Professional ---                        [phone], Esophagogastroduodenoscopy, flexible,                         transoral; with biopsy, single or multiple                        [id number], Dilation of esophagus, by unguided sound or                         bougie, single or multiple passes Diagnosis Code(s):     --- Professional ---                        K29.80, Duodenitis without bleeding                        K29.70, Gastritis, unspecified, without bleeding                        K22.89, Other specified disease of esophagus                        R13.10, Dysphagia, unspecified CPT copyright 2022 [employer]. All rights reserved. The codes documented in this report are preliminary and upon coder review may  be revised to meet current compliance requirements. Attending Participation:      I personally performed the entire procedure. Elfredia Nevins, DO Jaynie Collins DO, DO 06/23/2023 1:46:06 PM This report has been signed electronically. Number of Addenda:  0 Note Initiated On: 06/23/2023 1:15 PM Estimated Blood Loss:  Estimated blood loss was minimal.      [facility]

## 2023-06-23 NOTE — Anesthesia Preprocedure Evaluation (Signed)
Anesthesia Evaluation  Patient identified by MRN, date of birth, ID band Patient awake    Reviewed: Allergy & Precautions, H&P , NPO status , Patient's Chart, lab work & pertinent test results, reviewed documented beta blocker date and time   History of Anesthesia Complications (+) PONV and history of anesthetic complications  Airway Mallampati: II   Neck ROM: full    Dental  (+) Teeth Intact   Pulmonary shortness of breath and with exertion, asthma , sleep apnea and Continuous Positive Airway Pressure Ventilation , former smoker   Pulmonary exam normal        Cardiovascular Exercise Tolerance: Poor hypertension, negative cardio ROS Normal cardiovascular exam Rhythm:regular Rate:Normal     Neuro/Psych  Headaches PSYCHIATRIC DISORDERS Anxiety Depression     Neuromuscular disease    GI/Hepatic Neg liver ROS,GERD  Medicated,,  Endo/Other  diabetes  Class 3 obesity  Renal/GU      Musculoskeletal   Abdominal   Peds  Hematology  (+) Blood dyscrasia, anemia   Anesthesia Other Findings Past Medical History: No date: Anemia     Comment:  H/O No date: Anxiety No date: Arthritis No date: Asthma     Comment:  WELL CONTROLLED No date: Complication of anesthesia No date: Depression No date: Dyspnea     Comment:  HAS SEEN DR Gwen Pounds AND HAD STRESS TEST IN MARCH 2017               WITH NORMAL RESULTS No date: GERD (gastroesophageal reflux disease)     Comment:  OCC-TUMS No date: Headache     Comment:  H/O MIGRAINES No date: Neuropathy     Comment:  legs and right side No date: PONV (postoperative nausea and vomiting)     Comment:  distant past No date: PTSD (post-traumatic stress disorder) No date: Sleep apnea     Comment:  USES CPAP Past Surgical History: 1998: BREAST BIOPSY; Left     Comment:  bx/ clip- neg 04/09/2016: CHOLECYSTECTOMY; N/A     Comment:  Procedure: LAPAROSCOPIC CHOLECYSTECTOMY WITH                INTRAOPERATIVE CHOLANGIOGRAM;  Surgeon: Nadeen Landau, MD;  Location: [facility] ORS;  Service: General;                Laterality: N/A; No date: DIAGNOSTIC LAPAROSCOPY No date: DILATION AND CURETTAGE, DIAGNOSTIC / THERAPEUTIC No date: EYE SURGERY; Bilateral     Comment:  cataract 09/10/2019: KNEE ARTHROPLASTY; Right     Comment:  Procedure: COMPUTER ASSISTED TOTAL KNEE ARTHROPLASTY;                Surgeon: Donato Heinz, MD;  Location: [facility] ORS;                Service: Orthopedics;  Laterality: Right; No date: TUBAL LIGATION   Reproductive/Obstetrics negative OB ROS                             Anesthesia Physical Anesthesia Plan  ASA: 3  Anesthesia Plan: General   Post-op Pain Management: Minimal or no pain anticipated   Induction: Intravenous  PONV Risk Score and Plan: 3 and Propofol infusion, TIVA and Ondansetron  Airway Management Planned: Nasal Cannula  Additional Equipment: None  Intra-op Plan:   Post-operative Plan:   Informed Consent: I have reviewed the patients History  and Physical, chart, labs and discussed the procedure including the risks, benefits and alternatives for the proposed anesthesia with the patient or authorized representative who has indicated his/her understanding and acceptance.     Dental advisory given  Plan Discussed with: CRNA and Surgeon  Anesthesia Plan Comments: (Discussed risks of anesthesia with patient, including possibility of difficulty with spontaneous ventilation under anesthesia necessitating airway intervention, PONV, and rare risks such as cardiac or respiratory or neurological events, and allergic reactions. Discussed the role of CRNA in patient's perioperative care. Patient understands.)       Anesthesia Quick Evaluation

## 2023-06-24 NOTE — Anesthesia Postprocedure Evaluation (Signed)
Anesthesia Post Note  Patient: NEMIAH BUBAR  Procedure(s) Performed: COLONOSCOPY WITH PROPOFOL ESOPHAGOGASTRODUODENOSCOPY (EGD) WITH PROPOFOL BIOPSY MALONEY DILATION POLYPECTOMY  Patient location during evaluation: Endoscopy Anesthesia Type: General Level of consciousness: awake and alert Pain management: pain level controlled Vital Signs Assessment: post-procedure vital signs reviewed and stable Respiratory status: spontaneous breathing, nonlabored ventilation, respiratory function stable and patient connected to nasal cannula oxygen Cardiovascular status: blood pressure returned to baseline and stable Postop Assessment: no apparent nausea or vomiting Anesthetic complications: no   No notable events documented.   Last Vitals:  Vitals:   06/23/23 1431 06/23/23 1440  BP: (!) 98/56 109/69  Pulse: 73 75  Resp: 15 16  Temp:    SpO2: 95% 94%    Last Pain:  Vitals:   06/24/23 0727  TempSrc:   PainSc: 0-No pain                 Lenard Simmer

## 2023-06-26 LAB — SURGICAL PATHOLOGY

## 2023-06-27 ENCOUNTER — Encounter: Payer: Self-pay | Admitting: Gastroenterology

## 2023-09-23 NOTE — Progress Notes (Unsigned)
 [facility] [address] [place], [place] [address]  Pulmonary Sleep Medicine   Office Visit Note  Patient Name: Jacqueline Miller DOB: 08-18-1945 MRN [id number]    Chief Complaint: Obstructive Sleep Apnea visit  Brief History:  Jacqueline Miller is seen today for an annual follow up visit for APAP@ 4-20 cmH2O. The patient has a 9 year history of sleep apnea. Patient is using PAP nightly.  The patient feels somewhat rested after sleeping with PAP.  The patient reports benefiting from PAP use. Reported sleepiness is  improved and the Epworth Sleepiness Score is 19 out of 24. The patient will occasionally take long naps. The patient complains of the following: pt is complaining of unusual sleeping behavior that started almost 8 months ago. Patient states that when she sleeps, she feels like she is not sleeping. Patient complains that she is waking up more frequently in the middle of the night and has trouble returning to sleep. The compliance download shows 97% compliance with an average use time of 6 hours 51 minutes. The AHI is 2.3.  The patient does not complain of limb movements disrupting sleep. The patient continues to require PAP therapy in order to eliminate sleep apnea. The patient has been taking Mounjaro and has lost 20 lbs. She is taking 4-5 hour naps during the day.   ROS  General: (-) fever, (-) chills, (-) night sweat Nose and Sinuses: (-) nasal stuffiness or itchiness, (-) postnasal drip, (-) nosebleeds, (-) sinus trouble. Mouth and Throat: (-) sore throat, (-) hoarseness. Neck: (-) swollen glands, (-) enlarged thyroid, (-) neck pain. Respiratory: - cough, - shortness of breath, - wheezing. Neurologic: - numbness, - tingling. Psychiatric: + anxiety, - depression   Current Medication: Outpatient Encounter Medications as of 09/26/2023  Medication Sig   tirzepatide (MOUNJARO) 2.5 MG/0.5ML Pen Inject 2.5 mg into the skin once a week.   albuterol  (PROVENTIL  HFA;VENTOLIN  HFA)  108 (90 Base) MCG/ACT inhaler Inhale 1 puff into the lungs every 6 (six) hours as needed for wheezing or shortness of breath.    atorvastatin (LIPITOR) 20 MG tablet    Calcipotriene  0.005 % solution Apply 1 application topically every 30 (thirty) days.   Calcipotriene -Betameth Diprop (ENSTILAR) 0.005-0.064 % FOAM Apply 1 application topically daily as needed (psoriasis under arms).    calcium carbonate (OSCAL) 1500 (600 Ca) MG TABS tablet Take 1 tablet by mouth daily.   celecoxib  (CELEBREX ) 200 MG capsule Take 1 capsule (200 mg total) by mouth 2 (two) times daily.   Clobetasol  Propionate 0.05 % shampoo Apply topically.   diphenhydrAMINE  (BENADRYL ) 25 MG tablet Take 25 mg by mouth 3 (three) times daily as needed for allergies.    ergocalciferol  (VITAMIN D2) 1.25 MG (50000 UT) capsule Take 1 capsule by mouth once a week.   escitalopram  (LEXAPRO ) 20 MG tablet Take by mouth.   fluticasone-salmeterol (ADVAIR) 250-50 MCG/ACT AEPB Inhale into the lungs.   lisinopril (ZESTRIL) 20 MG tablet Take by mouth.   metFORMIN (GLUCOPHAGE) 500 MG tablet Take 500 mg by mouth daily with breakfast.   PRESCRIPTION MEDICATION Place 4 drops into the right eye in the morning, at noon, in the evening, and at bedtime. 3 in one eye drop   No facility-administered encounter medications on file as of 09/26/2023.    Surgical History: Past Surgical History:  Procedure Laterality Date   BIOPSY  06/23/2023   Procedure: BIOPSY;  Surgeon: Quintin Buckle, DO;  Location: [facility] ENDOSCOPY;  Service: Gastroenterology;;   BREAST BIOPSY  Left 1998   bx/ clip- neg   CHOLECYSTECTOMY N/A 04/09/2016   Procedure: LAPAROSCOPIC CHOLECYSTECTOMY WITH INTRAOPERATIVE CHOLANGIOGRAM;  Surgeon: Benancio Bracket, MD;  Location: [facility] ORS;  Service: General;  Laterality: N/A;   COLONOSCOPY WITH PROPOFOL  N/A 06/23/2023   Procedure: COLONOSCOPY WITH PROPOFOL ;  Surgeon: Quintin Buckle, DO;  Location: [facility] ENDOSCOPY;  Service:  Gastroenterology;  Laterality: N/A;   DIAGNOSTIC LAPAROSCOPY     DILATION AND CURETTAGE, DIAGNOSTIC / THERAPEUTIC     ESOPHAGOGASTRODUODENOSCOPY (EGD) WITH PROPOFOL  N/A 06/23/2023   Procedure: ESOPHAGOGASTRODUODENOSCOPY (EGD) WITH PROPOFOL ;  Surgeon: Quintin Buckle, DO;  Location: [facility] ENDOSCOPY;  Service: Gastroenterology;  Laterality: N/A;   EYE SURGERY Bilateral    cataract   JOINT REPLACEMENT     KNEE ARTHROPLASTY Right 09/10/2019   Procedure: COMPUTER ASSISTED TOTAL KNEE ARTHROPLASTY;  Surgeon: Arlyne Lame, MD;  Location: [facility] ORS;  Service: Orthopedics;  Laterality: Right;   KNEE ARTHROPLASTY Left 05/05/2020   Procedure: COMPUTER ASSISTED TOTAL KNEE ARTHROPLASTY;  Surgeon: Arlyne Lame, MD;  Location: [facility] ORS;  Service: Orthopedics;  Laterality: Left;   MALONEY DILATION  06/23/2023   Procedure: MALONEY DILATION;  Surgeon: Quintin Buckle, DO;  Location: [facility] ENDOSCOPY;  Service: Gastroenterology;;   POLYPECTOMY  06/23/2023   Procedure: POLYPECTOMY;  Surgeon: Quintin Buckle, DO;  Location: [facility] ENDOSCOPY;  Service: Gastroenterology;;   TUBAL LIGATION      Medical History: Past Medical History:  Diagnosis Date   Anemia    H/O   Anxiety    Arthritis    Asthma    WELL CONTROLLED   Complication of anesthesia    Depression    Diabetes mellitus without complication (HCC)    Dyspnea    HAS SEEN DR Bary Likes AND HAD STRESS TEST IN MARCH 2017 WITH NORMAL RESULTS   GERD (gastroesophageal reflux disease)    OCC-TUMS   Headache    H/O MIGRAINES   Hypertension    Neuropathy    legs and right side   PONV (postoperative nausea and vomiting)    distant past   Postmenopausal atrophic vaginitis    Psoriasis    PTSD (post-traumatic stress disorder)    Sleep apnea    USES CPAP    Family History: Non contributory to the present illness  Social History: Social History   Socioeconomic History   Marital status: Married    Spouse name: Not on file    Number of children: Not on file   Years of education: Not on file   Highest education level: Not on file  Occupational History   Not on file  Tobacco Use   Smoking status: Former    Types: Cigarettes   Smokeless tobacco: Never  Vaping Use   Vaping status: Never Used  Substance and Sexual Activity   Alcohol use: Yes    Comment: OCC   Drug use: No   Sexual activity: Not on file  Other Topics Concern   Not on file  Social History Narrative   Not on file   Social Drivers of Health   Financial Resource Strain: Low Risk  (04/05/2023)   Received from [facility]   Overall Financial Resource Strain (CARDIA)    Difficulty of Paying Living Expenses: Not very hard  Food Insecurity: No Food Insecurity (04/05/2023)   Received from [facility]   Hunger Vital Sign    Worried About Running Out of Food in the Last Year: Never true    Ran  Out of Food in the Last Year: Never true  Transportation Needs: No Transportation Needs (04/05/2023)   Received from [facility] - Transportation    In the past 12 months, has lack of transportation kept you from medical appointments or from getting medications?: No    Lack of Transportation (Non-Medical): No  Physical Activity: Inactive (02/21/2023)   Received from [facility]   Exercise Vital Sign    Days of Exercise per Week: 0 days    Minutes of Exercise per Session: 0 min  Stress: Stress Concern Present (02/21/2023)   Received from [facility] - Occupational Stress Questionnaire    Feeling of Stress : Very much  Social Connections: Not on file  Intimate Partner Violence: Not on file    Vital Signs: Blood pressure 118/79, pulse 73, resp. rate 16, height 5\' 1"  (1.549 m), weight 209 lb (94.8 kg), SpO2 96%. Body mass index is 39.49 kg/m.    Examination: General Appearance: The patient is well-developed,  well-nourished, and in no distress. Neck Circumference: 47 cm Skin: Gross inspection of skin unremarkable. Head: normocephalic, no gross deformities. Eyes: no gross deformities noted. ENT: ears appear grossly normal Neurologic: Alert and oriented. No involuntary movements.  STOP BANG RISK ASSESSMENT S (snore) Have you been told that you snore?     NO   T (tired) Are you often tired, fatigued, or sleepy during the day?   YES  O (obstruction) Do you stop breathing, choke, or gasp during sleep? NO   P (pressure) Do you have or are you being treated for high blood pressure? YES   B (BMI) Is your body index greater than 35 kg/m? YES   A (age) Are you 38 years old or older? YES   N (neck) Do you have a neck circumference greater than 16 inches?   YES   G (gender) Are you a female? NO   TOTAL STOP/BANG "YES" ANSWERS 4       A STOP-Bang score of 2 or less is considered low risk, and a score of 5 or more is high risk for having either moderate or severe OSA. For people who score 3 or 4, doctors may need to perform further assessment to determine how likely they are to have OSA.         EPWORTH SLEEPINESS SCALE:  Scale:  (0)= no chance of dozing; (1)= slight chance of dozing; (2)= moderate chance of dozing; (3)= high chance of dozing  Chance  Situtation    Sitting and reading: 3    Watching TV: 3    Sitting Inactive in public: 3    As a passenger in car: 3      Lying down to rest: 3    Sitting and talking: 2    Sitting quielty after lunch: 2    In a car, stopped in traffic: 0   TOTAL SCORE:   19 out of 24    SLEEP STUDIES:  PSG (07/2013) AHI 32/hr, RERA 50, min SPO2 88% Titration (08/2013) CPAP@ 13 cmH2O   CPAP COMPLIANCE DATA:  Date Range: 09/23/2022-09/22/2023  Average Daily Use: 6 hours 51 minutes  Median Use: 6 hours 35 minutes  Compliance for > 4 Hours: 97%  AHI: 2.3 respiratory events per hour  Days Used: 359/365 days  Mask Leak: 29.9  95th  Percentile Pressure: 16.3         LABS: No  results found for this or any previous visit (from the past 2160 hours).  Radiology: No results found.  No results found.  No results found.    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   Restless legs syndrome 07/19/2022   Major depressive disorder with single episode, in full remission (HCC) 11/09/2021   Hypertension 08/11/2020   CPAP use counseling 08/11/2020   Obesity with serious comorbidity 08/11/2020   Adult BMI 39.0-39.9 kg/sq m 08/11/2020   Total knee replacement status 05/05/2020   Status post total right knee replacement 09/10/2019   Headache 05/06/2019   Meralgia paresthetica 05/06/2019   Allergic conjunctivitis and rhinitis, bilateral 06/01/2016   Glucose intolerance (impaired glucose tolerance) 06/01/2016   Obesity, Class II, BMI 35-39.9 06/01/2016   OSA on CPAP 06/01/2016   Shortness of breath 07/14/2015   Asthma 09/10/2013   Atrophic vaginitis 09/10/2013   Vitamin D  deficiency 09/10/2013   PTSD (post-traumatic stress disorder) 07/18/2013   1. OSA on CPAP (Primary)  The patient does tolerate PAP and reports  benefit from PAP use. She reports daytime sleepiness, worse than previous year. She reports taking 4-5 hour naps during the day and I encouraged her to limit these to one hour. The patient was reminded how to clean equipment and advised to replace supplies routinely. The patient was also counselled on weiight loss. The compliance is excellent. The AHI is 2.3, but variable, with some nights with elevated out of target range AHI.   OSA on cpap- with ongoing hypersomnia. Will get cpap titration. Patient is agreeable. Encouraged to limit naps. F/u after setup.  2. CPAP use counseling CPAP Counseling: had a lengthy discussion with the patient regarding the importance of PAP therapy in management of the  sleep apnea. Patient appears to understand the risk factor reduction and also understands the risks associated with untreated sleep apnea. Patient will try to make a good faith effort to remain compliant with therapy. Also instructed the patient on proper cleaning of the device including the water must be changed daily if possible and use of distilled water is preferred. Patient understands that the machine should be regularly cleaned with appropriate recommended cleaning solutions that do not damage the PAP machine for example given white vinegar and water rinses. Other methods such as ozone treatment may not be as good as these simple methods to achieve cleaning.   3. Hypersomnia     The patient does tolerate PAP and reports  benefit from PAP use. She reports daytime sleepiness, worse than previous year. She reports taking 4-5 hour naps during the day and I encouraged her to limit these to one hour. The patient was reminded how to clean equipment and advised to replace supplies routinely. The patient was also counselled on weiight loss. The compliance is excellent. The AHI is 2.3, but variable, with some nights with elevated out of target range AHI.   OSA on cpap- with ongoing hypersomnia. Will get cpap titration. Patient is agreeable. Encouraged to limit naps. F/u after setup.   General Counseling: I have discussed the findings of the evaluation and examination with Jacqueline Miller.  I have also discussed any further diagnostic evaluation thatmay be needed or ordered today. Jacqueline Miller verbalizes understanding of the findings of todays visit. We also reviewed her medications today and discussed drug interactions and side effects including but not limited excessive drowsiness and altered mental states. We also discussed that  there is always a risk not just to her but also people around her. she has been encouraged to call the office with any questions or concerns that should arise related to todays visit.  No orders  of the defined types were placed in this encounter.       I have personally obtained a history, examined the patient, evaluated laboratory and imaging results, formulated the assessment and plan and placed orders.  Cordie Deters, MD [facility] Diplomate ABMS Pulmonary Critical Care Medicine and Sleep Medicine

## 2023-09-26 ENCOUNTER — Ambulatory Visit: Admitting: Internal Medicine

## 2023-09-26 VITALS — BP 118/79 | HR 73 | Resp 16 | Ht 61.0 in | Wt 209.0 lb

## 2023-09-26 DIAGNOSIS — G4710 Hypersomnia, unspecified: Secondary | ICD-10-CM | POA: Insufficient documentation

## 2023-09-26 DIAGNOSIS — G4733 Obstructive sleep apnea (adult) (pediatric): Secondary | ICD-10-CM

## 2023-09-26 DIAGNOSIS — Z7189 Other specified counseling: Secondary | ICD-10-CM

## 2023-09-26 NOTE — Patient Instructions (Signed)

## 2024-01-09 ENCOUNTER — Other Ambulatory Visit: Payer: Self-pay | Admitting: Family Medicine

## 2024-01-09 DIAGNOSIS — Z1231 Encounter for screening mammogram for malignant neoplasm of breast: Secondary | ICD-10-CM

## 2024-02-09 ENCOUNTER — Ambulatory Visit
Admission: RE | Admit: 2024-02-09 | Discharge: 2024-02-09 | Disposition: A | Discharge status: Home / Self Care | Source: Ambulatory Visit | Attending: Family Medicine | Admitting: Family Medicine

## 2024-02-09 DIAGNOSIS — Z1231 Encounter for screening mammogram for malignant neoplasm of breast: Secondary | ICD-10-CM | POA: Insufficient documentation
